# Patient Record
Sex: Male | Born: 1990
Health system: Southern US, Community
[De-identification: ages and names within clinical notes are randomized; demographics above are authoritative.]

## PROBLEM LIST (undated history)

## (undated) DIAGNOSIS — E669 Obesity, unspecified: Secondary | ICD-10-CM

## (undated) DIAGNOSIS — F419 Anxiety disorder, unspecified: Secondary | ICD-10-CM

## (undated) DIAGNOSIS — R05 Cough: Secondary | ICD-10-CM

## (undated) DIAGNOSIS — G8929 Other chronic pain: Principal | ICD-10-CM

## (undated) DIAGNOSIS — G4733 Obstructive sleep apnea (adult) (pediatric): Secondary | ICD-10-CM

## (undated) DIAGNOSIS — R0683 Snoring: Secondary | ICD-10-CM

## (undated) DIAGNOSIS — M25532 Pain in left wrist: Principal | ICD-10-CM

## (undated) DIAGNOSIS — F410 Panic disorder [episodic paroxysmal anxiety] without agoraphobia: Secondary | ICD-10-CM

## (undated) DIAGNOSIS — J3489 Other specified disorders of nose and nasal sinuses: Secondary | ICD-10-CM

## (undated) DIAGNOSIS — I493 Ventricular premature depolarization: Secondary | ICD-10-CM

## (undated) DIAGNOSIS — M659 Synovitis and tenosynovitis, unspecified: Secondary | ICD-10-CM

## (undated) DIAGNOSIS — K219 Gastro-esophageal reflux disease without esophagitis: Secondary | ICD-10-CM

## (undated) HISTORY — PX: WISDOM TOOTH EXTRACTION: SHX21

## (undated) HISTORY — DX: Obesity, unspecified: E66.9

## (undated) HISTORY — DX: Ventricular premature depolarization: I49.3

## (undated) HISTORY — DX: Snoring: R06.83

## (undated) HISTORY — DX: Other chronic pain: G89.29

## (undated) HISTORY — DX: Obstructive sleep apnea (adult) (pediatric): G47.33

## (undated) HISTORY — DX: Pain in left wrist: M25.532

---
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
## 2009-01-02 ENCOUNTER — Ambulatory Visit (HOSPITAL_COMMUNITY): Admission: RE | Admit: 2009-01-02 | Discharge: 2009-01-02 | Payer: Self-pay | Admitting: Podiatry

## 2009-06-04 ENCOUNTER — Encounter: Payer: Self-pay | Admitting: Cardiology

## 2012-01-19 ENCOUNTER — Ambulatory Visit
Admission: RE | Admit: 2012-01-19 | Discharge: 2012-01-19 | Disposition: A | Discharge status: Home / Self Care | Payer: 59 | Source: Ambulatory Visit | Attending: Family Medicine | Admitting: Family Medicine

## 2012-01-19 ENCOUNTER — Other Ambulatory Visit: Payer: Self-pay | Admitting: Family Medicine

## 2012-01-19 DIAGNOSIS — M79643 Pain in unspecified hand: Secondary | ICD-10-CM

## 2012-01-19 DIAGNOSIS — M25539 Pain in unspecified wrist: Secondary | ICD-10-CM

## 2012-02-19 DIAGNOSIS — M659 Synovitis and tenosynovitis, unspecified: Secondary | ICD-10-CM

## 2012-02-19 DIAGNOSIS — M65949 Unspecified synovitis and tenosynovitis, unspecified hand: Secondary | ICD-10-CM

## 2012-02-19 HISTORY — DX: Unspecified synovitis and tenosynovitis, unspecified hand: M65.949

## 2012-02-19 HISTORY — DX: Synovitis and tenosynovitis, unspecified: M65.9

## 2012-03-11 ENCOUNTER — Encounter (HOSPITAL_BASED_OUTPATIENT_CLINIC_OR_DEPARTMENT_OTHER): Payer: Self-pay | Admitting: *Deleted

## 2012-03-11 DIAGNOSIS — J3489 Other specified disorders of nose and nasal sinuses: Secondary | ICD-10-CM

## 2012-03-11 DIAGNOSIS — R059 Cough, unspecified: Secondary | ICD-10-CM

## 2012-03-11 HISTORY — DX: Other specified disorders of nose and nasal sinuses: J34.89

## 2012-03-11 HISTORY — DX: Cough, unspecified: R05.9

## 2012-03-16 ENCOUNTER — Encounter (HOSPITAL_BASED_OUTPATIENT_CLINIC_OR_DEPARTMENT_OTHER): Payer: Self-pay | Admitting: Anesthesiology

## 2012-03-16 ENCOUNTER — Encounter (HOSPITAL_BASED_OUTPATIENT_CLINIC_OR_DEPARTMENT_OTHER)
Admission: RE | Disposition: A | Discharge status: Home / Self Care | Payer: Self-pay | Source: Ambulatory Visit | Attending: Orthopedic Surgery

## 2012-03-16 ENCOUNTER — Encounter (HOSPITAL_BASED_OUTPATIENT_CLINIC_OR_DEPARTMENT_OTHER): Payer: Self-pay | Admitting: Certified Registered"

## 2012-03-16 ENCOUNTER — Ambulatory Visit (HOSPITAL_BASED_OUTPATIENT_CLINIC_OR_DEPARTMENT_OTHER): Payer: 59 | Admitting: Anesthesiology

## 2012-03-16 ENCOUNTER — Encounter (HOSPITAL_BASED_OUTPATIENT_CLINIC_OR_DEPARTMENT_OTHER): Payer: Self-pay | Admitting: Orthopedic Surgery

## 2012-03-16 ENCOUNTER — Ambulatory Visit (HOSPITAL_BASED_OUTPATIENT_CLINIC_OR_DEPARTMENT_OTHER)
Admission: RE | Admit: 2012-03-16 | Discharge: 2012-03-16 | Disposition: A | Discharge status: Home / Self Care | Payer: 59 | Source: Ambulatory Visit | Attending: Orthopedic Surgery | Admitting: Orthopedic Surgery

## 2012-03-16 DIAGNOSIS — M65839 Other synovitis and tenosynovitis, unspecified forearm: Secondary | ICD-10-CM | POA: Insufficient documentation

## 2012-03-16 HISTORY — DX: Synovitis and tenosynovitis, unspecified: M65.9

## 2012-03-16 HISTORY — DX: Cough: R05

## 2012-03-16 HISTORY — DX: Other specified disorders of nose and nasal sinuses: J34.89

## 2012-03-16 LAB — POCT HEMOGLOBIN-HEMACUE: Hemoglobin: 14.3 g/dL (ref 13.0–17.0)

## 2012-03-16 SURGERY — TENOSYNOVECTOMY
Anesthesia: General | Site: Wrist | Laterality: Left | Wound class: Clean

## 2012-03-16 MED ORDER — LACTATED RINGERS IV SOLN
INTRAVENOUS | Status: DC
  Administered 2012-03-16 (×2): via INTRAVENOUS

## 2012-03-16 MED ORDER — LORAZEPAM 2 MG/ML IJ SOLN: 1.0000 mg | Freq: Once | INTRAMUSCULAR | Status: DC | PRN

## 2012-03-16 MED ORDER — FENTANYL CITRATE 0.05 MG/ML IJ SOLN
INTRAMUSCULAR | Status: DC | PRN
  Administered 2012-03-16: 100 ug via INTRAVENOUS

## 2012-03-16 MED ORDER — LIDOCAINE HCL (CARDIAC) 20 MG/ML IV SOLN
INTRAVENOUS | Status: DC | PRN
  Administered 2012-03-16: 100 mg via INTRAVENOUS

## 2012-03-16 MED ORDER — MIDAZOLAM HCL 2 MG/2ML IJ SOLN: 1.0000 mg | INTRAMUSCULAR | Status: DC | PRN

## 2012-03-16 MED ORDER — MIDAZOLAM HCL 5 MG/5ML IJ SOLN
INTRAMUSCULAR | Status: DC | PRN
  Administered 2012-03-16: 2 mg via INTRAVENOUS

## 2012-03-16 MED ORDER — FENTANYL CITRATE 0.05 MG/ML IJ SOLN: 50.0000 ug | INTRAMUSCULAR | Status: DC | PRN

## 2012-03-16 MED ORDER — PROPOFOL 10 MG/ML IV EMUL
INTRAVENOUS | Status: DC | PRN
  Administered 2012-03-16: 300 mg via INTRAVENOUS

## 2012-03-16 MED ORDER — HYDROMORPHONE HCL PF 1 MG/ML IJ SOLN
0.2500 mg | INTRAMUSCULAR | Status: DC | PRN
  Administered 2012-03-16 (×2): 0.5 mg via INTRAVENOUS

## 2012-03-16 MED ORDER — CEFAZOLIN SODIUM 1-5 GM-% IV SOLN
INTRAVENOUS | Status: DC | PRN
  Administered 2012-03-16: 2 g via INTRAVENOUS

## 2012-03-16 MED ORDER — DOXYCYCLINE HYCLATE 50 MG PO CAPS: 100.0000 mg | ORAL_CAPSULE | Freq: Two times a day (BID) | ORAL | Status: AC

## 2012-03-16 MED ORDER — CHLORHEXIDINE GLUCONATE 4 % EX LIQD: 60.0000 mL | Freq: Once | CUTANEOUS | Status: DC

## 2012-03-16 MED ORDER — HYDROCODONE-ACETAMINOPHEN 5-500 MG PO TABS: 1.0000 | ORAL_TABLET | ORAL | Status: AC | PRN

## 2012-03-16 MED ORDER — BUPIVACAINE HCL (PF) 0.25 % IJ SOLN
INTRAMUSCULAR | Status: DC | PRN
  Administered 2012-03-16: 10 mL

## 2012-03-16 MED ORDER — DEXAMETHASONE SODIUM PHOSPHATE 4 MG/ML IJ SOLN
INTRAMUSCULAR | Status: DC | PRN
  Administered 2012-03-16: 8 mg via INTRAVENOUS

## 2012-03-16 SURGICAL SUPPLY — 67 items
BAG DECANTER FOR FLEXI CONT (MISCELLANEOUS) IMPLANT
BANDAGE GAUZE ELAST BULKY 4 IN (GAUZE/BANDAGES/DRESSINGS) ×2 IMPLANT
BLADE MINI RND TIP GREEN BEAV (BLADE) IMPLANT
BLADE SURG 15 STRL LF DISP TIS (BLADE) ×1 IMPLANT
BLADE SURG 15 STRL SS (BLADE) ×1
BNDG COHESIVE 3X5 TAN STRL LF (GAUZE/BANDAGES/DRESSINGS) ×2 IMPLANT
BNDG ESMARK 4X9 LF (GAUZE/BANDAGES/DRESSINGS) ×2 IMPLANT
CHLORAPREP W/TINT 26ML (MISCELLANEOUS) ×2 IMPLANT
CLOTH BEACON ORANGE TIMEOUT ST (SAFETY) ×2 IMPLANT
CONT SPECI 4OZ STER CLIK (MISCELLANEOUS) ×2 IMPLANT
CORDS BIPOLAR (ELECTRODE) ×2 IMPLANT
COVER MAYO STAND STRL (DRAPES) ×2 IMPLANT
COVER TABLE BACK 60X90 (DRAPES) ×2 IMPLANT
CUFF TOURNIQUET SINGLE 18IN (TOURNIQUET CUFF) IMPLANT
DECANTER SPIKE VIAL GLASS SM (MISCELLANEOUS) IMPLANT
DRAIN TLS ROUND 10FR (DRAIN) IMPLANT
DRAPE EXTREMITY T 121X128X90 (DRAPE) ×2 IMPLANT
DRAPE SURG 17X23 STRL (DRAPES) ×2 IMPLANT
GAUZE SPONGE 4X4 16PLY XRAY LF (GAUZE/BANDAGES/DRESSINGS) IMPLANT
GAUZE XEROFORM 1X8 LF (GAUZE/BANDAGES/DRESSINGS) ×2 IMPLANT
GLOVE BIO SURGEON STRL SZ 6.5 (GLOVE) ×2 IMPLANT
GLOVE BIO SURGEON STRL SZ8.5 (GLOVE) ×2 IMPLANT
GLOVE SURG ORTHO 8.0 STRL STRW (GLOVE) ×2 IMPLANT
GOWN BRE IMP PREV XXLGXLNG (GOWN DISPOSABLE) ×2 IMPLANT
GOWN PREVENTION PLUS XLARGE (GOWN DISPOSABLE) ×2 IMPLANT
GOWN STRL REIN 2XL LVL4 (GOWN DISPOSABLE) ×2 IMPLANT
KWIRE 4.0 X .035IN (WIRE) IMPLANT
LOOP VESSEL MAXI BLUE (MISCELLANEOUS) IMPLANT
NEEDLE 27GAX1X1/2 (NEEDLE) ×2 IMPLANT
NEEDLE HYPO 22GX1.5 SAFETY (NEEDLE) IMPLANT
NEEDLE KEITH (NEEDLE) IMPLANT
NS IRRIG 1000ML POUR BTL (IV SOLUTION) ×4 IMPLANT
PACK BASIN DAY SURGERY FS (CUSTOM PROCEDURE TRAY) ×2 IMPLANT
PAD CAST 3X4 CTTN HI CHSV (CAST SUPPLIES) ×1 IMPLANT
PADDING CAST ABS 3INX4YD NS (CAST SUPPLIES)
PADDING CAST ABS 4INX4YD NS (CAST SUPPLIES) ×1
PADDING CAST ABS COTTON 3X4 (CAST SUPPLIES) IMPLANT
PADDING CAST ABS COTTON 4X4 ST (CAST SUPPLIES) ×1 IMPLANT
PADDING CAST COTTON 3X4 STRL (CAST SUPPLIES) ×1
SLEEVE SCD COMPRESS KNEE MED (MISCELLANEOUS) ×2 IMPLANT
SPLINT PLASTER CAST XFAST 3X15 (CAST SUPPLIES) ×5 IMPLANT
SPLINT PLASTER XTRA FASTSET 3X (CAST SUPPLIES) ×5
SPONGE GAUZE 4X4 12PLY (GAUZE/BANDAGES/DRESSINGS) ×2 IMPLANT
STOCKINETTE 4X48 STRL (DRAPES) ×2 IMPLANT
SUT ETHIBOND 3-0 V-5 (SUTURE) IMPLANT
SUT ETHILON 4 0 PS 2 18 (SUTURE) ×2 IMPLANT
SUT MERSILENE 2.0 SH NDLE (SUTURE) IMPLANT
SUT MERSILENE 3 0 FS 1 (SUTURE) IMPLANT
SUT MERSILENE 4 0 P 3 (SUTURE) IMPLANT
SUT PROLENE 2 0 SH DA (SUTURE) IMPLANT
SUT SILK 4 0 PS 2 (SUTURE) IMPLANT
SUT STEEL 4 0 V 26 (SUTURE) IMPLANT
SUT VIC AB 3-0 PS1 18 (SUTURE)
SUT VIC AB 3-0 PS1 18XBRD (SUTURE) IMPLANT
SUT VIC AB 4-0 P-3 18XBRD (SUTURE) IMPLANT
SUT VIC AB 4-0 P3 18 (SUTURE)
SUT VICRYL 4-0 PS2 18IN ABS (SUTURE) ×2 IMPLANT
SUT VICRYL RAPID 5 0 P 3 (SUTURE) IMPLANT
SUT VICRYL RAPIDE 4/0 PS 2 (SUTURE) ×2 IMPLANT
SWAB CULTURE LIQ STUART DBL (MISCELLANEOUS) ×2 IMPLANT
SYR BULB 3OZ (MISCELLANEOUS) ×2 IMPLANT
SYR CONTROL 10ML LL (SYRINGE) ×2 IMPLANT
TOWEL OR 17X24 6PK STRL BLUE (TOWEL DISPOSABLE) ×4 IMPLANT
TUBE ANAEROBIC SPECIMEN COL (MISCELLANEOUS) ×2 IMPLANT
TUBE FEEDING 5FR 15 INCH (TUBING) IMPLANT
UNDERPAD 30X30 INCONTINENT (UNDERPADS AND DIAPERS) ×2 IMPLANT
WATER STERILE IRR 1000ML POUR (IV SOLUTION) ×2 IMPLANT

## 2012-03-16 NOTE — Op Note (Signed)
NAME:  Derrick Hart, Derrick Hart NO.:  [id number]  MEDICAL RECORD NO.:  [id number]  LOCATION:                                 FACILITY:  PHYSICIAN:  Cindee Salt, M.D.            DATE OF BIRTH:  DATE OF PROCEDURE:  03/16/2012 DATE OF DISCHARGE:                              OPERATIVE REPORT   PREOPERATIVE DIAGNOSES:  Tenosynovitis extensor tendons, index and middle fingers, left wrist.  POSTOPERATIVE DIAGNOSIS:  Tenosynovitis extensor tendons, index and middle fingers, left wrist.  OPERATION:  Tenosynovectomy with biopsy of wrist synovium, debridement of capitate metacarpals, index middle fingers.  SURGEON:  Cindee Salt, M.D.  ASSISTANT:  Artist Pais. Mina Marble, M.D.  ANESTHESIA:  General with local infiltration.  ANESTHESIOLOGIST:  Bedelia Person, M.D.  HISTORY:  The patient is a 21 year old male with a 2-3 month history of swelling of the dorsum of his hand.  This has not responded to conservative treatment.  MRI reveals damage to the capitate, the base of the index, middle finger metacarpals with a major tenosynovitis, swelling of the joint.  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  He is aware that there is no guarantee with the surgery, possibility of infection, recurrence, injury to arteries, nerves, tendons, incomplete relief of symptoms, dystrophy.  Preoperative area, the patient was seen, the extremity was marked by both the patient and surgeon.  Antibiotic given.  PROCEDURE:  The patient was brought to the operating room where a general anesthetic was carried out without difficulty.  He was prepped using ChloraPrep, supine position, left arm free.  Three minutes dry time was allowed.  Time-out taken, confirming the patient, procedure. The limb was exsanguinated from the wrist proximally with an Esmarch bandage.  Tourniquet placed and the arm was  inflated to 250 mmHg.  A longitudinal incision was made over the dorsum of his wrist carried down through subcutaneous tissue.  Bleeders were electrocauterized with bipolar.  Dorsal nerve was identified and protected.  The tenosynovium was opened.  This was found to be swollen.  A biopsy was taken of the tenosynovial tissue and sent.  Cultures were also taken, both aerobic and anaerobic cultures.  The dissection was carried into the wrist.  A very significant synovitis was present with a brownish thin multilobulated friable tissue.  This appeared to be external to the bone, although there was some ingrowth into the base of the metacarpal of the index middle fingers.  The entire area was debrided with a rongeur.  Cultures were again taken for both aerobic and anaerobic, fungal and AFB cultures including marinum.  These were sent to pathology.  No gross purulence was noted.  The wound was copiously irrigated with saline.  The cartilage appeared to be intact, and to capitate the STT joint, both of which had involvement.  The capsule was then closed after irrigation with figure-of-eight 4-0 Vicryl sutures. The retinaculum closed with interrupted 4-0 Vicryl, the subcutaneous tissue with interrupted 4-0 Vicryl, and the skin with interrupted 4-0 nylon sutures.  A sterile compressive dressing and  splint to the wrist was applied.  On deflation of the tourniquet, all fingers immediately pinked.  He was taken to the recovery room for observation in satisfactory condition.          ______________________________ Cindee Salt, M.D.     GK/MEDQ  D:  03/16/2012  T:  03/16/2012  Job:  [id number]  cc:   Dyke Brackett, M.D.

## 2012-03-16 NOTE — Anesthesia Procedure Notes (Signed)
Procedure Name: LMA Insertion Date/Time: 03/16/2012 11:00 AM Performed by: Verlan Friends Pre-anesthesia Checklist: Patient identified, Emergency Drugs available, Suction available, Patient being monitored and Timeout performed Patient Re-evaluated:Patient Re-evaluated prior to inductionOxygen Delivery Method: Circle System Utilized Preoxygenation: Pre-oxygenation with 100% oxygen Intubation Type: IV induction Ventilation: Mask ventilation without difficulty LMA: LMA inserted LMA Size: 5.0 Number of attempts: 1 (atraumatic) Airway Equipment and Method: bite block (Left posterior bite gard used) Placement Confirmation: positive ETCO2 Tube secured with: Tape (clear tape used) Dental Injury: Teeth and Oropharynx as per pre-operative assessment

## 2012-03-16 NOTE — Discharge Instructions (Addendum)
  [facility]  [address] [place], [place] [address] [phone]   Post Anesthesia Home Care Instructions  Activity: Get plenty of rest for the remainder of the day. A responsible adult should stay with you for 24 hours following the procedure.  For the next 24 hours, DO NOT: -Drive a car -Operate machinery -Drink alcoholic beverages -Take any medication unless instructed by your physician -Make any legal decisions or sign important papers.  Meals: Start with liquid foods such as gelatin or soup. Progress to regular foods as tolerated. Avoid greasy, spicy, heavy foods. If nausea and/or vomiting occur, drink only clear liquids until the nausea and/or vomiting subsides. Call your physician if vomiting continues.  Special Instructions/Symptoms: Your throat may feel dry or sore from the anesthesia or the breathing tube placed in your throat during surgery. If this causes discomfort, gargle with warm salt water. The discomfort should disappear within 24 hours.    [facility] Instructions Hand Surgery  Wound Care: Keep your hand elevated above the level of your heart.  Do not allow it to dangle  by your side.  Keep the dressing dry and do not remove it unless your doctor advises you to do so.  He will usually change it at the time of your post-op visit.  Moving your fingers is advised to stimulate circulation but will depend on the site of your surgery.  If you have a splint applied, your doctor will advise you regarding movement.  Activity: Do not drive or operate machinery today.  Rest today and then you may return to your normal activity and work as indicated by your physician.  Diet:  Drink liquids today or eat a light diet.  You may resume a regular diet tomorrow.    General expectations: Pain for two to three days. Fingers may become slightly swollen.  Call your doctor if any of the following occur: Severe pain not relieved by pain  medication. Elevated temperature. Dressing soaked with blood. Inability to move fingers. White or bluish color to fingers. 

## 2012-03-16 NOTE — Op Note (Signed)
Dictated [phone] [id number]

## 2012-03-16 NOTE — Anesthesia Preprocedure Evaluation (Addendum)
Anesthesia Evaluation  Patient identified by MRN, date of birth, ID band Patient awake    Reviewed: Allergy & Precautions, H&P , NPO status , Patient's Chart, lab work & pertinent test results  Airway Mallampati: I TM Distance: >3 FB Neck ROM: Full    Dental   Pulmonary Recent URI ,  + rhonchi         Cardiovascular     Neuro/Psych  Neuromuscular disease    GI/Hepatic   Endo/Other    Renal/GU      Musculoskeletal   Abdominal   Peds  Hematology   Anesthesia Other Findings Afebrile Improving cough, nonproductive  Reproductive/Obstetrics                          Anesthesia Physical Anesthesia Plan  ASA: II  Anesthesia Plan: General   Post-op Pain Management:    Induction: Intravenous  Airway Management Planned: LMA  Additional Equipment:   Intra-op Plan:   Post-operative Plan: Extubation in OR  Informed Consent: I have reviewed the patients History and Physical, chart, labs and discussed the procedure including the risks, benefits and alternatives for the proposed anesthesia with the patient or authorized representative who has indicated his/her understanding and acceptance.     Plan Discussed with: CRNA and Surgeon  Anesthesia Plan Comments:         Anesthesia Quick Evaluation

## 2012-03-16 NOTE — Brief Op Note (Signed)
03/16/2012  11:39 AM  PATIENT:  Derrick Hart  21 y.o. male  PRE-OPERATIVE DIAGNOSIS:  tenosynovitis left hand  POST-OPERATIVE DIAGNOSIS:  * No post-op diagnosis entered *  PROCEDURE:  Procedure(s) (LRB): TENOSYNOVECTOMY (Left)  SURGEON:  Surgeon(s) and Role:    * Nicki Reaper, MD - Primary    * Marlowe Shores, MD - Assisting  PHYSICIAN ASSISTANT:   ASSISTANTS: Alyssa Grove, MD   ANESTHESIA:   local and general  EBL:  Total I/O In: 200 [I.V.:200] Out: -   BLOOD ADMINISTERED:none  DRAINS: none   LOCAL MEDICATIONS USED:  MARCAINE     SPECIMEN:  Biopsy / Limited Resection  DISPOSITION OF SPECIMEN:  PATHOLOGY  COUNTS:  YES  TOURNIQUET:  * Missing tourniquet times found for documented tourniquets in log:  30826 *  DICTATION: .Other Dictation: Dictation Number [id number]  PLAN OF CARE: Discharge to home after PACU  PATIENT DISPOSITION:  PACU - hemodynamically stable.

## 2012-03-16 NOTE — H&P (Signed)
   Derrick Hart is a 21 year old right hand dominant male referred by Dr. Madelon Lips for a consultation with respect to swelling and pain of his left hand. This has been going on for approximately 2 months. He has no specific history of injury. He has no prior history of occurrence. He is not complaining of any fevers or chills. He states that he has had falls snowboarding. He has been placed in a brace and on Motrin which gave him some relief of symptoms. The swelling is primarily in the dorsal aspect of his hand. He complains of constant, moderate, dull, aching type pain with a feeling of swelling and weakness. It has not significantly changed. It will occasionally awaken him at night. Activity, exercise and work makes this worse, ice has helped along with Aleve and a brace. No history of diabetes, thyroid problems, arthritis or gout. There is no history of recent illnesses predating the beginning of the occurrence of this problem.  Past Medical History: He has no allergies. He takes no medicine. He has had no surgery.  Family Medical History: Negative.  Social History: He does not smoke. He drinks socially. He is single and a Investment banker, operational at [employer].  Review of Systems: Positive for contacts, otherwise negative for 14 points. Derrick Hart is an 21 y.o. male.   Chief Complaint: swelling lt wrist HPI: see above  Past Medical History  Diagnosis Date  . Tenosynovitis of hand 02/2012    left  . Cough 03/11/2012  . Stuffy and runny nose 03/11/2012    clear drainage from nose    Past Surgical History  Procedure Date  . Wisdom tooth extraction     History reviewed. No pertinent family history. Social History:  reports that he has never smoked. He has never used smokeless tobacco. He reports that he drinks alcohol. He reports that he does not use illicit drugs.  Allergies: No Known Allergies  No current facility-administered medications on file as of 03/16/2012.   No current outpatient  prescriptions on file as of 03/16/2012.    No results found for this or any previous visit (from the past 48 hour(s)).  No results found.   Pertinent items are noted in HPI.  Height 6\' 4"  (1.93 m), weight 120.203 kg (265 lb).  General appearance: alert, cooperative and appears stated age Head: Normocephalic, without obvious abnormality Neck: no adenopathy Resp: clear to auscultation bilaterally Cardio: regular rate and rhythm, S1, S2 normal, no murmur, click, rub or gallop GI: soft, non-tender; bowel sounds normal; no masses,  no organomegaly Extremities: extremities normal, atraumatic, no cyanosis or edema Pulses: 2+ and symmetric Skin: Skin color, texture, turgor normal. No rashes or lesions Neurologic: Grossly normal Incision/Wound: na  Assessment/Plan He has had his MRI done revealing significant changes with a very significant tenosynovitis with changes in the base of his metacarpals.  The joints themselves look good.  As far as his wrist is concerned his laboratory work was normal except for an elevated C-reactive protein.  He shows no increased white count.  It is difficult to determine exactly what is going on. We would recommend a tenosynovectomy be performed. He does work with shellfish.  There is always potential that following a cut he may have developed an atypical mycobacterial infection.    We have scheduled for tenosynovectomy, biopsy metacarpal base index or middle finger of his left hand   Derrick Hart 03/16/2012, 7:39 AM

## 2012-03-16 NOTE — Transfer of Care (Signed)
Immediate Anesthesia Transfer of Care Note  Patient: Derrick Hart  Procedure(s) Performed: Procedure(s) (LRB): TENOSYNOVECTOMY (Left)  Patient Location: PACU  Anesthesia Type: General  Level of Consciousness: awake, alert , oriented and patient cooperative  Airway & Oxygen Therapy: Patient Spontanous Breathing and Patient connected to face mask oxygen  Post-op Assessment: Report given to PACU RN and Post -op Vital signs reviewed and stable  Post vital signs: Reviewed and stable  Complications: No apparent anesthesia complications

## 2012-03-19 LAB — WOUND CULTURE

## 2012-03-19 LAB — TISSUE CULTURE

## 2012-03-21 LAB — ANAEROBIC CULTURE

## 2012-03-23 NOTE — Anesthesia Postprocedure Evaluation (Signed)
  Anesthesia Post-op Note  Patient: Derrick Hart  Procedure(s) Performed: Procedure(s) (LRB): TENOSYNOVECTOMY (Left)  Patient Location: PACU  Anesthesia Type: General  Level of Consciousness: awake  Airway and Oxygen Therapy: Patient Spontanous Breathing  Post-op Pain: mild  Post-op Assessment: Post-op Vital signs reviewed  Post-op Vital Signs: stable  Complications: No apparent anesthesia complications

## 2012-04-12 LAB — FUNGUS CULTURE W SMEAR: Fungal Smear: NONE SEEN

## 2012-04-28 LAB — AFB CULTURE WITH SMEAR (NOT AT ARMC)

## 2012-05-24 ENCOUNTER — Encounter (HOSPITAL_COMMUNITY): Payer: Self-pay | Admitting: *Deleted

## 2012-05-24 ENCOUNTER — Emergency Department (HOSPITAL_COMMUNITY)
Admission: EM | Admit: 2012-05-24 | Discharge: 2012-05-24 | Disposition: A | Discharge status: Home / Self Care | Payer: 59 | Attending: Emergency Medicine | Admitting: Emergency Medicine

## 2012-05-24 DIAGNOSIS — T887XXA Unspecified adverse effect of drug or medicament, initial encounter: Secondary | ICD-10-CM | POA: Insufficient documentation

## 2012-05-24 DIAGNOSIS — T40605A Adverse effect of unspecified narcotics, initial encounter: Secondary | ICD-10-CM | POA: Insufficient documentation

## 2012-05-24 DIAGNOSIS — T50905A Adverse effect of unspecified drugs, medicaments and biological substances, initial encounter: Secondary | ICD-10-CM

## 2012-05-24 MED ORDER — ONDANSETRON 8 MG PO TBDP
8.0000 mg | ORAL_TABLET | Freq: Once | ORAL | Status: AC
  Administered 2012-05-24: 8 mg via ORAL
  Filled 2012-05-24: qty 1

## 2012-05-24 MED ORDER — HYDROCODONE-ACETAMINOPHEN 5-500 MG PO TABS: 1.0000 | ORAL_TABLET | Freq: Four times a day (QID) | ORAL | Status: AC | PRN

## 2012-05-24 MED ORDER — ONDANSETRON HCL 4 MG PO TABS: 4.0000 mg | ORAL_TABLET | Freq: Three times a day (TID) | ORAL | Status: AC | PRN

## 2012-05-24 NOTE — Discharge Instructions (Signed)
Stop taking Ultram/Tramadol.  Rest today, drink plenty of fluids, stick to a bland diet.  Change your pain medication to hydrocodone.     Drug Reaction, GI Intolerance The upset stomach you are having may be from the medications you are taking. Often an upset stomach from medication comes from irritation of the stomach. This is often not a true allergic reaction.  CAUSES  Some medications can cause reactions. This may be a mild rash, nausea and vomiting, or a life- threatening reaction called anaphylaxis. Anaphylaxis involves the whole body.  HOME CARE INSTRUCTIONS   If the only problem you are having is an upset stomach, taking the medication with some food or something in your stomach may make the problem go away. If this remedy does not work, you should contact your caregiver. It may be necessary to switch medication.   This will not work if the medication must be taken on an empty stomach.  SEEK MEDICAL CARE IF:   You develop other problems that are getting worse rather than better.   You develop new symptoms.   There is a return of the symptoms that brought you to your caregiver or to the emergency department.  SEEK IMMEDIATE MEDICAL CARE IF:   You have difficulty breathing, are wheezing, or you have a tight feeling in your chest or throat.   You have a swollen mouth, or you have hives, swelling, or itching all over your body. THIS IS AN EMERGENCY - CALL 911.   You develop severe vomiting or diarrhea.   You feel faint or pass out. THIS IS AN EMERGENCY! FAMILY OR CAREGIVER SHOULD CALL 911.  MAKE SURE YOU:   Understand these instructions.   Will watch your condition.   Will get help right away if you are not doing well or get worse.  Document Released: 11/15/2006 Document Revised: 11/26/2011 Document Reviewed: 11/25/2007 [facility] Patient Information 2012 [place], [place].Marland Kitchen

## 2012-05-24 NOTE — ED Provider Notes (Signed)
History     CSN: [id number]  Arrival date & time 05/24/12  0503   First MD Initiated Contact with Patient 05/24/12 0510      Chief Complaint  Patient presents with  . Shortness of Breath    (Consider location/radiation/quality/duration/timing/severity/associated sxs/prior treatment) HPI 21 year old male presents to emergency department complaining of nausea, chills, shortness of breath, anxiety. Patient started new medication yesterday, tramadol. Patient has history of chronic pain in his left hand due to T. no synovitis. He was started on tramadol by his orthopedist. Patient has not taken Ultram in the past. Patient reports he feels "weird". He feels like he is going to pass out. He denies any chest pain. He has occasional palpitations.   Past Medical History  Diagnosis Date  . Tenosynovitis of hand 02/2012    left  . Cough 03/11/2012  . Stuffy and runny nose 03/11/2012    clear drainage from nose    Past Surgical History  Procedure Date  . Wisdom tooth extraction     No family history on file.  History  Substance Use Topics  . Smoking status: Never Smoker   . Smokeless tobacco: Never Used  . Alcohol Use: Yes     occasionally      Review of Systems  All other systems reviewed and are negative.    Allergies  Review of patient's allergies indicates no known allergies.  Home Medications   Current Outpatient Rx  Name Route Sig Dispense Refill  . TRAMADOL HCL 50 MG PO TABS Oral Take 50 mg by mouth every 6 (six) hours as needed. For pain      BP 146/91  Pulse 81  Temp(Src) 98.4 F (36.9 C) (Oral)  Resp 20  SpO2 99%  Physical Exam  Nursing note and vitals reviewed. Constitutional: He is oriented to person, place, and time. He appears well-developed and well-nourished.  HENT:  Head: Normocephalic and atraumatic.  Nose: Nose normal.  Mouth/Throat: Oropharynx is clear and moist.  Eyes: Conjunctivae and EOM are normal. Pupils are equal, round, and reactive  to light.  Neck: Normal range of motion. Neck supple. No JVD present. No tracheal deviation present. No thyromegaly present.  Cardiovascular: Normal rate, regular rhythm, normal heart sounds and intact distal pulses.  Exam reveals no gallop and no friction rub.   No murmur heard. Pulmonary/Chest: Effort normal and breath sounds normal. No stridor. No respiratory distress. He has no wheezes. He has no rales. He exhibits no tenderness.  Abdominal: Soft. Bowel sounds are normal. He exhibits no distension and no mass. There is no tenderness. There is no rebound and no guarding.  Musculoskeletal: Normal range of motion. He exhibits no edema and no tenderness.  Lymphadenopathy:    He has no cervical adenopathy.  Neurological: He is oriented to person, place, and time. He has normal reflexes. No cranial nerve deficit. He exhibits normal muscle tone. Coordination normal.  Skin: Skin is dry. No rash noted. No erythema. No pallor.  Psychiatric: He has a normal mood and affect. His behavior is normal. Judgment and thought content normal.    ED Course  Procedures (including critical care time)  Labs Reviewed - No data to display No results found.   1. Medication reaction      Date: 05/24/2012  Rate: 68  Rhythm: normal sinus rhythm  QRS Axis: normal  Intervals: normal  ST/T Wave abnormalities: early repolarization  Conduction Disutrbances:none  Narrative Interpretation: q waves inferiorly, unchanged from prior.  t wave inversion in  III new.  Old EKG Reviewed: changes noted   MDM  21 yo male with probable adverse reaction to ultram.  Will check ekg, orthostatics, and tx with zofran.    7:32 AM Pt feeling better.  Will d/c home.        Olivia Mackie, MD 05/24/12 [phone]

## 2012-05-24 NOTE — ED Notes (Signed)
Pt states he feels dizzy and feels like he is going to pass out.  BP is 146/91, HR 76, and 98% on RA and temp of 98.4.Pt is having nausea at this time also.

## 2012-05-24 NOTE — ED Notes (Signed)
Pt started on tramadol yesterday at 3pm; took a second tramadol at 2100; states unable to fall asleep; started feeling "weird" around 0230; started having chills/sweating/nausea. States felt like having panic attack about an hour pta. Hyperventilating on arrival. sats 100%

## 2013-08-11 ENCOUNTER — Encounter (HOSPITAL_COMMUNITY): Payer: Self-pay | Admitting: Emergency Medicine

## 2013-08-11 ENCOUNTER — Emergency Department (HOSPITAL_COMMUNITY): Payer: 59

## 2013-08-11 ENCOUNTER — Emergency Department (HOSPITAL_COMMUNITY)
Admission: EM | Admit: 2013-08-11 | Discharge: 2013-08-11 | Disposition: A | Discharge status: Home / Self Care | Payer: 59 | Attending: Emergency Medicine | Admitting: Emergency Medicine

## 2013-08-11 DIAGNOSIS — Z8709 Personal history of other diseases of the respiratory system: Secondary | ICD-10-CM | POA: Insufficient documentation

## 2013-08-11 DIAGNOSIS — R079 Chest pain, unspecified: Secondary | ICD-10-CM | POA: Diagnosis present

## 2013-08-11 DIAGNOSIS — R0602 Shortness of breath: Secondary | ICD-10-CM | POA: Insufficient documentation

## 2013-08-11 DIAGNOSIS — F410 Panic disorder [episodic paroxysmal anxiety] without agoraphobia: Secondary | ICD-10-CM | POA: Diagnosis present

## 2013-08-11 DIAGNOSIS — Z79899 Other long term (current) drug therapy: Secondary | ICD-10-CM | POA: Insufficient documentation

## 2013-08-11 DIAGNOSIS — Z8739 Personal history of other diseases of the musculoskeletal system and connective tissue: Secondary | ICD-10-CM | POA: Insufficient documentation

## 2013-08-11 DIAGNOSIS — Z791 Long term (current) use of non-steroidal anti-inflammatories (NSAID): Secondary | ICD-10-CM | POA: Insufficient documentation

## 2013-08-11 DIAGNOSIS — R0789 Other chest pain: Secondary | ICD-10-CM | POA: Insufficient documentation

## 2013-08-11 NOTE — ED Notes (Signed)
Pt states that today he felt a sharp pain in chest every time he took a deep breath now he feels as if he cannot catch his breath. Denies headache or dizziness

## 2013-08-11 NOTE — ED Provider Notes (Signed)
CSN: [id number]     Arrival date & time 08/11/13  1358 History     First MD Initiated Contact with Patient 08/11/13 1457     Chief Complaint  Patient presents with  . chest discomfo   . Shortness of Breath   (Consider location/radiation/quality/duration/timing/severity/associated sxs/prior Treatment) Patient is a 22 y.o. male presenting with shortness of breath. The history is provided by the patient.  Shortness of Breath Severity:  Mild Onset quality:  Sudden Duration: 1.5 hrs. Timing:  Constant Progression:  Resolved Chronicity: has had similar sx w/ panic attacks. 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 Worsened by:  Nothing tried Ineffective treatments:  None tried Associated symptoms: chest pain (left upper chest, worse with breathing, lasted 10 minutes and resolved)   Associated symptoms: no abdominal pain, no cough, no fever, no headaches, no neck pain and no vomiting     Past Medical History  Diagnosis Date  . Tenosynovitis of hand 02/2012    left  . Cough 03/11/2012  . Stuffy and runny nose 03/11/2012    clear drainage from nose   Past Surgical History  Procedure Laterality Date  . Wisdom tooth extraction     No family history on file. History  Substance Use Topics  . Smoking status: Never Smoker   . Smokeless tobacco: Never Used  . Alcohol Use: Yes     Comment: occasionally    Review of Systems  Constitutional: Negative for fever.  HENT: Negative for rhinorrhea, drooling and neck pain.   Eyes: Negative for pain.  Respiratory: Positive for shortness of breath. Negative for cough.   Cardiovascular: Positive for chest pain (left upper chest, worse with breathing, lasted 10 minutes and resolved). Negative for leg swelling.  Gastrointestinal: Negative for nausea, vomiting, abdominal pain and diarrhea.  Genitourinary: Negative for dysuria and hematuria.  Musculoskeletal: Negative for gait problem.  Skin: Negative for color change.   Neurological: Negative for numbness and headaches.  Hematological: Negative for adenopathy.  Psychiatric/Behavioral: Negative for behavioral problems.  All other systems reviewed and are negative.    Allergies  Tramadol  Home Medications   Current Outpatient Rx  Name  Route  Sig  Dispense  Refill  . ALPRAZolam (XANAX) 0.5 MG tablet   Oral   Take 0.5 mg by mouth daily as needed for sleep.         . naproxen sodium (ANAPROX) 220 MG tablet   Oral   Take 440 mg by mouth 2 (two) times daily with a meal.         . sertraline (ZOLOFT) 100 MG tablet   Oral   Take 100 mg by mouth daily.          BP 141/82  Pulse 107  Temp(Src) 98.2 F (36.8 C) (Oral)  Resp 22  SpO2 97% Physical Exam  Nursing note and vitals reviewed. Constitutional: He is oriented to person, place, and time. He appears well-developed and well-nourished.  HENT:  Head: Normocephalic and atraumatic.  Right Ear: External ear normal.  Left Ear: External ear normal.  Nose: Nose normal.  Mouth/Throat: Oropharynx is clear and moist. No oropharyngeal exudate.  Eyes: Conjunctivae and EOM are normal. Pupils are equal, round, and reactive to light.  Neck: Normal range of motion. Neck supple.  Cardiovascular: Normal rate, regular rhythm, normal heart sounds and intact distal pulses.  Exam reveals no gallop and no friction rub.   No murmur heard. Pulmonary/Chest: Effort normal and breath sounds normal. No respiratory  distress. He has no wheezes.  Abdominal: Soft. Bowel sounds are normal. He exhibits no distension. There is no tenderness. There is no rebound and no guarding.  Musculoskeletal: Normal range of motion. He exhibits no edema and no tenderness.  Normal appearing LE's. No asymmetry. 2+ distal pulses.   Neurological: He is alert and oriented to person, place, and time.  Skin: Skin is warm and dry.  Psychiatric: He has a normal mood and affect. His behavior is normal.    ED Course   Procedures  (including critical care time)  Labs Reviewed - No data to display Dg Chest 2 View  08/11/2013   *RADIOLOGY REPORT*  Clinical Data: Left-sided chest pain.  CHEST - 2 VIEW  Comparison: None.  Findings: Central pulmonary vascular prominence.  No infiltrate, congestive heart failure or pneumothorax.  Heart size within normal limits.  IMPRESSION: Central pulmonary vascular prominence.  No infiltrate, congestive heart failure or pneumothorax.  Heart size within normal limits.   Original Report Authenticated By: Lacy Duverney, M.D.   1. Panic attack   2. Chest pain      Date: 08/11/2013  Rate: 95  Rhythm: normal sinus rhythm  QRS Axis: normal  Intervals: normal  ST/T Wave abnormalities: normal  Conduction Disutrbances:none  Narrative Interpretation: No ST or T wave changes consistent with ischemia.    Old EKG Reviewed: none available    MDM  3:07 PM 22 y.o. male w hx of panic attacks (usually 1 per week) pw sudden onset left sided sharp cp and then anxiety/sob. Sx lasted 1-1.5 hrs and resolved w/ xanax. Pt asx on exam, appears well, now feeling better. Doubt PE as sx resolved. HR 107 initially in triage, now 95 on exam and at rest. Wells/Perc neg.   4:00 PM: Pt continues to appear well. Remains asx. I have discussed the diagnosis/risks/treatment options with the patient and believe the pt to be eligible for discharge home to follow-up with pcp as needed. We also discussed returning to the ED immediately if new or worsening sx occur. We discussed the sx which are most concerning (e.g., further cp, sob, fever) that necessitate immediate return. Any new prescriptions provided to the patient are listed below.  New Prescriptions   No medications on file        Junius Argyle, MD 08/12/13 [phone]

## 2015-06-03 ENCOUNTER — Emergency Department (HOSPITAL_COMMUNITY): Payer: 59

## 2015-06-03 ENCOUNTER — Encounter (HOSPITAL_COMMUNITY): Payer: Self-pay | Admitting: *Deleted

## 2015-06-03 ENCOUNTER — Emergency Department (HOSPITAL_COMMUNITY)
Admission: EM | Admit: 2015-06-03 | Discharge: 2015-06-03 | Disposition: A | Discharge status: Home / Self Care | Payer: 59 | Attending: Emergency Medicine | Admitting: Emergency Medicine

## 2015-06-03 DIAGNOSIS — R0789 Other chest pain: Secondary | ICD-10-CM | POA: Diagnosis not present

## 2015-06-03 DIAGNOSIS — Z8614 Personal history of Methicillin resistant Staphylococcus aureus infection: Secondary | ICD-10-CM | POA: Insufficient documentation

## 2015-06-03 DIAGNOSIS — Z8739 Personal history of other diseases of the musculoskeletal system and connective tissue: Secondary | ICD-10-CM | POA: Diagnosis not present

## 2015-06-03 DIAGNOSIS — Z8709 Personal history of other diseases of the respiratory system: Secondary | ICD-10-CM | POA: Diagnosis not present

## 2015-06-03 DIAGNOSIS — F410 Panic disorder [episodic paroxysmal anxiety] without agoraphobia: Secondary | ICD-10-CM | POA: Insufficient documentation

## 2015-06-03 DIAGNOSIS — F419 Anxiety disorder, unspecified: Secondary | ICD-10-CM

## 2015-06-03 DIAGNOSIS — Z79899 Other long term (current) drug therapy: Secondary | ICD-10-CM | POA: Diagnosis not present

## 2015-06-03 DIAGNOSIS — R0602 Shortness of breath: Secondary | ICD-10-CM | POA: Diagnosis not present

## 2015-06-03 DIAGNOSIS — Z791 Long term (current) use of non-steroidal anti-inflammatories (NSAID): Secondary | ICD-10-CM | POA: Insufficient documentation

## 2015-06-03 DIAGNOSIS — R079 Chest pain, unspecified: Secondary | ICD-10-CM | POA: Diagnosis present

## 2015-06-03 HISTORY — DX: Anxiety disorder, unspecified: F41.9

## 2015-06-03 HISTORY — DX: Panic disorder (episodic paroxysmal anxiety): F41.0

## 2015-06-03 LAB — CBC WITH DIFFERENTIAL/PLATELET
BASOS ABS: 0 10*3/uL (ref 0.0–0.1)
Basophils Relative: 0 % (ref 0–1)
Eosinophils Absolute: 0.1 10*3/uL (ref 0.0–0.7)
Eosinophils Relative: 1 % (ref 0–5)
HCT: 44.8 % (ref 39.0–52.0)
Hemoglobin: 15.7 g/dL (ref 13.0–17.0)
LYMPHS ABS: 2.5 10*3/uL (ref 0.7–4.0)
LYMPHS PCT: 29 % (ref 12–46)
MCH: 29.7 pg (ref 26.0–34.0)
MCHC: 35 g/dL (ref 30.0–36.0)
MCV: 84.8 fL (ref 78.0–100.0)
Monocytes Absolute: 0.8 10*3/uL (ref 0.1–1.0)
Monocytes Relative: 10 % (ref 3–12)
NEUTROS ABS: 5.2 10*3/uL (ref 1.7–7.7)
NEUTROS PCT: 60 % (ref 43–77)
PLATELETS: 211 10*3/uL (ref 150–400)
RBC: 5.28 MIL/uL (ref 4.22–5.81)
RDW: 12.6 % (ref 11.5–15.5)
WBC: 8.6 10*3/uL (ref 4.0–10.5)

## 2015-06-03 LAB — BRAIN NATRIURETIC PEPTIDE: B NATRIURETIC PEPTIDE 5: 6 pg/mL (ref 0.0–100.0)

## 2015-06-03 LAB — BASIC METABOLIC PANEL
ANION GAP: 9 (ref 5–15)
BUN: 10 mg/dL (ref 6–20)
CHLORIDE: 100 mmol/L — AB (ref 101–111)
CO2: 26 mmol/L (ref 22–32)
Calcium: 9.5 mg/dL (ref 8.9–10.3)
Creatinine, Ser: 0.94 mg/dL (ref 0.61–1.24)
Glucose, Bld: 107 mg/dL — ABNORMAL HIGH (ref 65–99)
POTASSIUM: 4.5 mmol/L (ref 3.5–5.1)
SODIUM: 135 mmol/L (ref 135–145)

## 2015-06-03 LAB — TROPONIN I
Troponin I: <0.03 ng/mL (ref <0.031)
Troponin I: <0.03 ng/mL (ref <0.031)

## 2015-06-03 NOTE — Discharge Instructions (Signed)
°Emergency Department Resource Guide °1) Find a Doctor and Pay Out of Pocket °Although you won't have to find out who is covered by your insurance plan, it is a good idea to ask around and get recommendations. You will then need to call the office and see if the doctor you have chosen will accept you as a new patient and what types of options they offer for patients who are self-pay. Some doctors offer discounts or will set up payment plans for their patients who do not have insurance, but you will need to ask so you aren't surprised when you get to your appointment. ° °2) Contact Your Local Health Department °Not all health departments have doctors that can see patients for sick visits, but many do, so it is worth a call to see if yours does. If you don't know where your local health department is, you can check in your phone book. The CDC also has a tool to help you locate your state's health department, and many state websites also have listings of all of their local health departments. ° °3) Find a Walk-in Clinic °If your illness is not likely to be very severe or complicated, you may want to try a walk in clinic. These are popping up all over the country in pharmacies, drugstores, and shopping centers. They're usually staffed by nurse practitioners or physician assistants that have been trained to treat common illnesses and complaints. They're usually fairly quick and inexpensive. However, if you have serious medical issues or chronic medical problems, these are probably not your best option. ° °No Primary Care Doctor: °- Call [facility] at  832-8000 - they can help you locate a primary care doctor that  accepts your insurance, provides certain services, etc. °- Physician Referral Service- 1-800-533-3463 ° °Chronic Pain Problems: °Organization         Address  Phone   Notes  °[facility]  (336) 297-2271 Patients need to be referred by their primary care doctor.  ° °Medication  Assistance: °Organization         Address  Phone   Notes  °[place] [facility] [address] °[place], [place] [address] (336) 641-8030 --Must be a resident of [place] °-- Must have NO insurance coverage whatsoever (no Medicaid/ Medicare, etc.) °-- The pt. MUST have a primary care doctor that directs their care regularly and follows them in the community °  °[facility]  (866) 331-1348   °[employer]  (888) 892-1162   ° °Agencies that provide inexpensive medical care: °Organization         Address  Phone   Notes  °[facility]  (336) 832-8035   °[facility]    (336) 832-7272   °[facility] [address] °[place], [place] [address] (336) 832-4777   °[facility] [address], °[place] (336) 271-4999   °[facility]    (336) 373-0678   °[facility]    (336) 272-1050   °[facility] ° [address], [place] Phone:  (336) 832-4444, Fax:  (336) 832-4440 Hours of Operation:  9 am - 6 pm, M-F.  Also accepts Medicaid/Medicare and self-pay.  °[facility] ° [address], [place] Phone: (336) [phone], Fax: (336) [phone]. Hours of Operation:  8:30 am - 5:30 pm, M-F.  Also accepts Medicaid and self-pay.  °[facility] [place] [address], [place] Phone: (336) 878-6027   °[facility] [address], [place], [place] (336)723-1848, Ext. 123 Mondays & Thursdays: 7-9 AM.  First 15 patients are seen on a first come, first serve basis. °  ° °Medicaid-accepting [place] Providers: ° °Organization         Address  Phone   Notes  °[facility] [address], [place] (336) 641-2100 Also accepts self-pay patients.  °[facility] [address], [place] ° (336) 856-9996   °[facility] [address], [place]  (336) 288-8857   °[facility] [address] [place] [address], [place] (336) 299-7000   °Veita Bland [address], [place]  ° (336) 373-1557 Only accepts [place] Access Medicaid patients after they have their name applied to their card.  ° °Self-Pay (no insurance) in [place]: ° °Organization         Address  Phone   Notes  °Sickle Cell Patients, [facility] [address], [place] (336) 832-1970   °[facility] [address], [place] (336) 832-4400   °[facility] ° [address], [place] (336) 992-4800   °[facility]/Dr. Osei-Bonsu ° [address] [place] [address], [place] or [address], [place] (336) 841-8500 Phone number for both [place] and [place] locations is the same.  °[facility] [address], [place] (336) 299-0000   °[facility] [address] [place] [address], [place] or [address] (336) 852-7530 °(336) 878-2260   °[facility] [address], [place] (336) 350-1642, phone; (336) 294-5005, fax Sees patients 1st and 3rd Saturday of every month.  Must not qualify for public or private insurance (i.e. Medicaid, Medicare, [place] Health Choice, Veterans' Benefits) • Household income should be no more than 200% of the poverty level •The clinic cannot treat you if you are pregnant or think you are pregnant • Sexually transmitted diseases are not treated at the clinic.  ° ° °Dental Care: °Organization         Address  Phone  Notes  °[place] [facility] [address], [place] (336) 641-6152 Accepts children up to age 21 who are enrolled in Medicaid or [place] Health Choice; pregnant women with a Medicaid card; and children who have applied for Medicaid or [place] Health Choice, but were declined, whose parents can pay a reduced fee at time of service.  °[place]  [facility] [place]  [address], [place] (336) 641-7733 Accepts children up to age 21 who are enrolled in Medicaid or [place] Health Choice; pregnant women with a Medicaid card; and children who have applied for Medicaid or  Health Choice, but were declined, whose parents can pay a reduced fee at time of service.  °[facility] ° [address], [place] (336) 641-4533 Patients are seen by appointment only. Walk-ins are not accepted. [facility] will see patients 18 years of age and older. °Monday - Tuesday (8am-5pm) °Most Wednesdays (8:30-5pm) °$30 per visit, cash only  °[facility] ° [address], [place] (336) 641-4533 Patients are seen by appointment only. Walk-ins are not accepted. [facility] will see patients 18 years of age and older. °One   Wednesday Evening (Monthly: Volunteer Based).  $30 per visit, cash only  °[facility]  (919) 537-3737 for adults; Children under age 4, call Graduate Pediatric Dentistry at ([phone]) [phone]. Children aged 4-14, please call (919) [phone] to request a pediatric application. ° Dental services are provided in all areas of dental care including fillings, crowns and bridges, complete and partial dentures, implants, gum treatment, root canals, and extractions. Preventive care is also provided. Treatment is provided to both adults and children. °Patients are selected via a lottery and there is often a waiting list. °  °[facility] [address], °[place] ° (336) 763-8833 [email or web address] °  °[facility] [address], [place], [place] (336)723-1848, Ext. 123 Second and Fourth Thursday of each month, opens at 6:30 AM; Clinic ends at 9 AM.  Patients are seen on a first-come first-served basis, and a limited number are seen during each clinic.  ° °[facility] ° [address], [place], [place] (336) 723-7904    Eligibility Requirements °You must have lived in [place], [place], or [place] counties for at least the last three months. °  You cannot be eligible for state or federal sponsored healthcare insurance, including Veterans Administration, Medicaid, or Medicare. °  You generally cannot be eligible for healthcare insurance through your employer.  °  How to apply: °Eligibility screenings are held every Tuesday and Wednesday afternoon from 1:00 pm until 4:00 pm. You do not need an appointment for the interview!  °[facility] [address], [place], [place] 336-631-2330   °[facility]  336-342-8273   °[facility]  336-703-3100   °[facility]  336-570-6415   ° °Behavioral Health Resources in the Community: °Intensive Outpatient Programs °Organization         Address  Phone  Notes  °[facility] [address], [place], [place] 336-878-6098   °[facility] [address], [place], [place] 336-832-9800   °[facility] [address], [place], [place] ° 336-882-2125   °[facility] [address],  °[place], [place] 1-800-853-5163 or 336-641-4981   °Substance Abuse Resources °Organization         Address  Phone  Notes  °[facility]  336-882-2125   °[facility]  336-784-9470   °[facility]  336-285-9073   °[facility]  336-845-3988   °Residential & Outpatient Substance Abuse Program  1-800-659-3381   °Psychological Services °Organization         Address  Phone  Notes  °[facility]  336- 832-9600   °[facility]  336- 378-7881   °[facility] [address], [place] 1-800-853-5163 or 336-641-4981   ° °Mobile Crisis Teams °Organization         Address  Phone  Notes  °[facility], Mobile Crisis Care Unit  1-877-626-1772   °Assertive °[facility] ° [address]  [place], [place] 336-834-9664   °Sharon DeEsch [address] °[place] 336-554-5454   ° °Self-Help/Support Groups °Organization         Address  Phone             Notes  °[facility] - variety of support groups  336- 373-1402 Call for more information  °Narcotics Anonymous (NA), [facility] [address], °[place]   2 meetings at this location  ° °  Residential Treatment Programs °Organization         Address  Phone  Notes  °[facility] [address],    °[place]  1-866-801-8205   °[facility] ° [address], [place], [place] 704-293-8524   °[facility] [address], [place] 336-845-3988 Admissions: 8am-3pm M-F  °[facility] [address]    °[place], [place] 336-841-1104   °[facility] [address], [place], [place] 336-379-7146   °[facility] [address]  °[place], [place] 336-285-9073   °[facility] Intensive Outpatient [address], [place], [place] 336-852-3033   °[facility] [address]  °[place], [place] 1-877-615-2722 or 336-784-9470   °[facility] [address] [place], [place] 336-227-7417 Accepts Medicaid  °[facility] [address]  °[place] 1-800-659-3381 Substance Abuse/Addiction Treatment  ° °[place] Behavioral Health Resources °Organization         Address  Phone  Notes  °[facility]  (888) 581-9988   °Julie Brannon, PhD [address] [place], [place]   (336) 349-5553 or (336) 951-0000   °[facility]   [address] °[place], [place] (336) 349-4454   °[facility] [address], [place], [place] (336) 342-8316 Insurance/Medicaid/sponsorship through [facility]  °[facility] [address]                                    [place], [place] (336) 342-8316 Therapy/tele-psych/case    °[facility] [address]  ° [place], [place] (336) 349-2233    °Dr. Arfeen  (336) 349-4544   °[facility] [place]  [employer] [place] [facility] 1) [address],  °2) [address], [place] °3)  [address], [place] (336) 349-3220 °(336) 342-7768 ° °(336) 342-8140   °[place] [facility] (336) 342-1394 or (336) 342-3537 (After Hours)    ° ° °Take your usual prescriptions as previously directed.  Call your regular medical doctor today to schedule a follow up appointment within the next 2 days.  Return to the Emergency Department immediately sooner if worsening.  ° °

## 2015-06-03 NOTE — ED Provider Notes (Signed)
CSN: [id number]     Arrival date & time 06/03/15  0026 History   First MD Initiated Contact with Patient 06/03/15 0344     Chief Complaint  Patient presents with  . Chest Pain      HPI Pt was seen at 0350. Per pt, c/o gradual onset and persistence of constant chest "pain" and SOB that began at 2200 PTA. Pt states he "just hasn't been feeling well" for the past 2 to 3 days. Pt states his PMD started him on levaquin "after they swabbed my nose and said I had MRSA." Pt states he "thinks I had a panic attack tonight." States he took a xanax with partial improvement of his symptoms. Denies palpitations, no cough, no abd pain, no N/V/D, no back pain, no fevers, no rash, no calf/LE pain or unilateral swelling.    Past Medical History  Diagnosis Date  . Tenosynovitis of hand 02/2012    left  . Cough 03/11/2012  . Stuffy and runny nose 03/11/2012    clear drainage from nose  . Anxiety   . Panic attack    Past Surgical History  Procedure Laterality Date  . Wisdom tooth extraction      History  Substance Use Topics  . Smoking status: Never Smoker   . Smokeless tobacco: Never Used  . Alcohol Use: Yes     Comment: occasionally    Review of Systems ROS: Statement: All systems negative except as marked or noted in the HPI; Constitutional: Negative for fever and chills. ; ; Eyes: Negative for eye pain, redness and discharge. ; ; ENMT: Negative for ear pain, hoarseness, nasal congestion, sinus pressure and sore throat. ; ; Cardiovascular: +CP, SOB. Negative for palpitations, diaphoresis, and peripheral edema. ; ; Respiratory: Negative for cough, wheezing and stridor. ; ; Gastrointestinal: Negative for nausea, vomiting, diarrhea, abdominal pain, blood in stool, hematemesis, jaundice and rectal bleeding. . ; ; Genitourinary: Negative for dysuria, flank pain and hematuria. ; ; Musculoskeletal: Negative for back pain and neck pain. Negative for swelling and trauma.; ; Skin: Negative for pruritus, rash,  abrasions, blisters, bruising and skin lesion.; ; Neuro: Negative for headache, lightheadedness and neck stiffness. Negative for weakness, altered level of consciousness , altered mental status, extremity weakness, paresthesias, involuntary movement, seizure and syncope.; Psych:  +anxious. No SI, no SA, no HI, no hallucinations.    Allergies  Tramadol  Home Medications   Prior to Admission medications   Medication Sig Start Date End Date Taking? Authorizing Provider  ALPRAZolam Prudy Feeler) 0.5 MG tablet Take 0.5 mg by mouth daily as needed for sleep.    Historical Provider, MD  naproxen sodium (ANAPROX) 220 MG tablet Take 440 mg by mouth 2 (two) times daily with a meal.    Historical Provider, MD  sertraline (ZOLOFT) 100 MG tablet Take 100 mg by mouth daily.    Historical Provider, MD   BP 125/85 mmHg  Pulse 78  Temp(Src) 98.2 F (36.8 C) (Oral)  Resp 18  Ht  (1.93 m)  Wt 350 lb (158.759 kg)  BMI 42.62 kg/m2  SpO2 95%   Filed Vitals:   06/03/15 0345 06/03/15 0400 06/03/15 0430 06/03/15 0515  BP: 121/70 125/85 119/70 123/61  Pulse: 80 78 73 90  Temp:      TempSrc:      Resp: Height:      Weight:      SpO2: 92% 95% 94% 96%    Physical Exam  0454: Physical examination:  Nursing notes reviewed; Vital signs and O2 SAT reviewed;  Constitutional: Well developed, Well nourished, Well hydrated, In no acute distress; Head:  Normocephalic, atraumatic; Eyes: EOMI, PERRL, No scleral icterus; ENMT: Mouth and pharynx normal, Mucous membranes moist; Neck: Supple, Full range of motion, No lymphadenopathy; Cardiovascular: Regular rate and rhythm, No murmur, rub, or gallop; Respiratory: Breath sounds clear & equal bilaterally, No rales, rhonchi, wheezes.  Speaking full sentences with ease, Normal respiratory effort/excursion; Chest: Nontender, Movement normal; Abdomen: Soft, Nontender, Nondistended, Normal bowel sounds; Genitourinary: No CVA tenderness; Extremities: Pulses normal, No  tenderness, No edema, No calf edema or asymmetry.; Neuro: AA&Ox3, Major CN grossly intact.  Speech clear. No gross focal motor or sensory deficits in extremities.; Skin: Color normal, Warm, Dry.   ED Course  Procedures     EKG Interpretation   Date/Time:  Monday June 03 2015 00:32:59 EDT Ventricular Rate:  96 PR Interval:  168 QRS Duration: 84 QT Interval:  334 QTC Calculation: 421 R Axis:   79 Text Interpretation:  Normal sinus rhythm Normal ECG When compared with  ECG of 08/11/2013 No significant change was found Confirmed by [facility]   MD, Nicholos Johns [phone]) on 06/03/2015 4:34:40 AM      MDM  MDM Reviewed: previous chart, nursing note and vitals Reviewed previous: labs and ECG Interpretation: labs, ECG and x-ray      Results for orders placed or performed during the hospital encounter of 06/03/15  Basic metabolic panel  Result Value Ref Range   Sodium 135 135 - 145 mmol/L   Potassium 4.5 3.5 - 5.1 mmol/L   Chloride 100 (L) 101 - 111 mmol/L   CO2 26 22 - 32 mmol/L   Glucose, Bld 107 (H) 65 - 99 mg/dL   BUN 10 6 - 20 mg/dL   Creatinine, Ser 9.14 0.61 - 1.24 mg/dL   Calcium 9.5 8.9 - 78.2 mg/dL   GFR calc non Af Amer >60 >60 mL/min   GFR calc Af Amer >60 >60 mL/min   Anion gap 9 5 - 15  BNP (order ONLY if patient complains of dyspnea/SOB AND you have documented it for THIS visit)  Result Value Ref Range   B Natriuretic Peptide 6.0 0.0 - 100.0 pg/mL  Troponin I  Result Value Ref Range   Troponin I <0.03 <0.031 ng/mL  CBC with Differential  Result Value Ref Range   WBC 8.6 4.0 - 10.5 K/uL   RBC 5.28 4.22 - 5.81 MIL/uL   Hemoglobin 15.7 13.0 - 17.0 g/dL   HCT 95.6 21.3 - 08.6 %   MCV 84.8 78.0 - 100.0 fL   MCH 29.7 26.0 - 34.0 pg   MCHC 35.0 30.0 - 36.0 g/dL   RDW 57.8 46.9 - 62.9 %   Platelets 211 150 - 400 K/uL   Neutrophils Relative % 60 43 - 77 %   Neutro Abs 5.2 1.7 - 7.7 K/uL   Lymphocytes Relative 29 12 - 46 %   Lymphs Abs 2.5 0.7 - 4.0 K/uL    Monocytes Relative 10 3 - 12 %   Monocytes Absolute 0.8 0.1 - 1.0 K/uL   Eosinophils Relative 1 0 - 5 %   Eosinophils Absolute 0.1 0.0 - 0.7 K/uL   Basophils Relative 0 0 - 1 %   Basophils Absolute 0.0 0.0 - 0.1 K/uL  Troponin I  Result Value Ref Range   Troponin I <0.03 <0.031 ng/mL   Dg Chest 2 View 06/03/2015   CLINICAL  DATA:  Left and Mid chest pain since yesterday.  EXAM: CHEST  2 VIEW  COMPARISON:  08/11/2013  FINDINGS: The heart size and mediastinal contours are within normal limits. Both lungs are clear. The visualized skeletal structures are unremarkable.  IMPRESSION: No active cardiopulmonary disease.   Electronically Signed   By: Burman Nieves M.D.   On: 06/03/2015 01:48    0545:  Doubt PE as cause for symptoms with low risk [place].  Doubt ACS as cause for symptoms with normal troponin x2 and unchanged EKG from previous after 6+ hours of constant symptoms. VS remain stable. Unclear why he is taking abx; will have pt call his PMD this morning to clarify. Pt wants to go home now. Dx and testing d/w pt and family.  Questions answered.  Verb understanding, agreeable to d/c home with outpt f/u.    Samuel Jester, DO 06/06/15 1320

## 2015-06-03 NOTE — ED Notes (Signed)
The pt has panic attacks and he  Started having chest pain and sob 2 hours ago.  He took a xanax that did not help

## 2015-07-24 ENCOUNTER — Encounter (HOSPITAL_COMMUNITY): Payer: Self-pay | Admitting: Emergency Medicine

## 2015-07-24 ENCOUNTER — Emergency Department (HOSPITAL_COMMUNITY)
Admission: EM | Admit: 2015-07-24 | Discharge: 2015-07-24 | Disposition: A | Discharge status: Home / Self Care | Payer: 59 | Source: Home / Self Care

## 2015-07-24 DIAGNOSIS — R55 Syncope and collapse: Secondary | ICD-10-CM

## 2015-07-24 DIAGNOSIS — R002 Palpitations: Secondary | ICD-10-CM

## 2015-07-24 MED ORDER — ALPRAZOLAM 0.5 MG PO TABS: 0.5000 mg | ORAL_TABLET | Freq: Every evening | ORAL | Status: DC | PRN

## 2015-07-24 NOTE — Discharge Instructions (Signed)
We are referring you to see a cardiologist for your palpitations.    Your EKG looks good here.  Make sure you're drinking enough water.  If your symptoms return or you have chest pain, go to the ED.

## 2015-07-24 NOTE — ED Notes (Signed)
Lightheaded, anxiety, onset one week ago.  Patient has tried to reduce stress, diet, and exercise.

## 2015-07-24 NOTE — ED Notes (Signed)
Provided patient hard copy script

## 2015-07-24 NOTE — ED Provider Notes (Signed)
CSN: [id number]     Arrival date & time 07/24/15  1648 History   None    Chief Complaint  Patient presents with  . Dizziness   (Consider location/radiation/quality/duration/timing/severity/associated sxs/prior Treatment) HPI:  24 yo M presenting with about 1 month's worth of palpitations and lightheadedness. Started when prescribed Levaquin and omeprazole at that time.  States never had palpitations before those 2 medicines.  Stopped them about 1 week ago.  Symptoms have continued.  Describes both "fluttering" and "pounding" in chest in AM when arises and when trying to fall asleep.  Lightheaded occasionally when at work but never has to sit for this.  No N/V.  Occasonal dyspnea when palpitations occur.  No LE edema.  No chest pain.   Past Medical History  Diagnosis Date  . Tenosynovitis of hand 02/2012    left  . Cough 03/11/2012  . Stuffy and runny nose 03/11/2012    clear drainage from nose  . Anxiety   . Panic attack    Past Surgical History  Procedure Laterality Date  . Wisdom tooth extraction     No family history on file. History  Substance Use Topics  . Smoking status: Never Smoker   . Smokeless tobacco: Never Used  . Alcohol Use: Yes     Comment: occasionally    Review of Systems  Allergies  Tramadol  Home Medications   Prior to Admission medications   Medication Sig Start Date End Date Taking? Authorizing Provider  ALPRAZolam Prudy Feeler) 0.5 MG tablet Take 0.5 mg by mouth daily as needed for sleep.    Historical Provider, MD  ALPRAZolam Prudy Feeler) 0.5 MG tablet Take 1 tablet (0.5 mg total) by mouth at bedtime as needed for anxiety. 07/24/15   Tobey Grim, MD  naproxen sodium (ANAPROX) 220 MG tablet Take 440 mg by mouth 2 (two) times daily with a meal.    Historical Provider, MD  sertraline (ZOLOFT) 100 MG tablet Take 100 mg by mouth daily.    Historical Provider, MD   BP 134/91 mmHg  Pulse 91  Temp(Src) 98.3 F (36.8 C) (Oral)  Resp 16  SpO2 97% Physical Exam   Gen:  Alert, cooperative patient who appears stated age in no acute distress.  Vital signs reviewed. HEENT:  [place]/AT.  EOMI.  PERRL.  MMM.  Hypertrophic  Neck:  No thryomegaly Cardiac:  Regular rate and rhythm without murmur auscultated.  Good S1/S2. Pulm:  Clear to auscultation bilaterally with good air movement.  No wheezes or rales noted.   Abd:  Soft/nondistended/nontender.  Good bowel sounds throughout all four quadrants.  No masses noted.  Ext:  No LE edema.      ED Course  Procedures (including critical care time) Labs Review Labs Reviewed - No data to display  Imaging Review No results found.   MDM   1. Palpitations   2. Pre-syncope    - EKG done here.  NSR, no QTC prolongation.  No concerns for malignant arthymia.  - Can call to set up appt with cardiology if continues for loop recorder/prolonged monitoring.   -discussed likely anxiety component.  Refill for #15 Xanax.  FU with PCP.  - not truly orthostatic, but pulse did increase by 20 when standing.  Recommended increase fluid intake in the heat.   Tobey Grim, MD 07/24/15 [phone]

## 2015-09-23 ENCOUNTER — Encounter: Payer: Self-pay | Admitting: Cardiology

## 2015-10-07 DIAGNOSIS — G8929 Other chronic pain: Secondary | ICD-10-CM

## 2015-10-07 DIAGNOSIS — M25532 Pain in left wrist: Principal | ICD-10-CM

## 2015-10-07 HISTORY — DX: Other chronic pain: G89.29

## 2015-10-09 ENCOUNTER — Encounter: Payer: Self-pay | Admitting: Cardiology

## 2015-10-09 ENCOUNTER — Ambulatory Visit (INDEPENDENT_AMBULATORY_CARE_PROVIDER_SITE_OTHER): Payer: 59 | Admitting: Cardiology

## 2015-10-09 ENCOUNTER — Ambulatory Visit (INDEPENDENT_AMBULATORY_CARE_PROVIDER_SITE_OTHER): Payer: 59

## 2015-10-09 VITALS — BP 122/72 | HR 99 | Ht 76.0 in | Wt 377.0 lb

## 2015-10-09 DIAGNOSIS — R002 Palpitations: Secondary | ICD-10-CM

## 2015-10-09 DIAGNOSIS — R079 Chest pain, unspecified: Secondary | ICD-10-CM

## 2015-10-09 DIAGNOSIS — F410 Panic disorder [episodic paroxysmal anxiety] without agoraphobia: Secondary | ICD-10-CM

## 2015-10-09 NOTE — Progress Notes (Signed)
Cardiology Office Note   Date:  10/09/2015   ID:  Derrick Finlandhomas S Ure, DOB Oct 12, 1991, MRN [id number]  PCP:  Leanor RubensteinSUN,VYVYAN Y, MD    Chief Complaint  Patient presents with  . Palpitations  . Chest Pain      History of Present Illness: Derrick Hart is a 24 y.o. male who presents for evaluation of palpitation.  He has a history of palpitations remotely in the past.  He recently was seen by his PCP and stated that ever since his ENT treated him with Omeprazol and Levaquin for a MRSA sinusitis and GERD he has had palpitations.  On the 3rd day of the omeprazole and the first day of Levaquin he developed CP and palptiations.  He coughed hard and it resolved.  He went to the ER and was given Xanax.  His Levaquin was changed to Septra and PPI changed to Protonix and then dexilant.  He stopped the dexilant and his symptoms resovled except for occasional palpitaitons.  He feels like it is a skipped beat.  This has interfered some with his sleep.  He has a long history of anxiety.  He would get very lightheaded with his palpitations but no syncope.  He occasionally gets sharp chest pain that started after omeprazole.  It is very random and is nonexertional.  He describes it as a sharp pulsating pain that is off and on lasting a second to 5 minutes.  He denies any diaphoresis or nausea.  He has some DOE but he attributes this to anxiety and this occurs when he lays down.  He snores at night according to his girlfriend.  He has no daytime sleepiness.      Past Medical History  Diagnosis Date  . Tenosynovitis of hand 02/2012    left  . Cough 03/11/2012  . Stuffy and runny nose 03/11/2012    clear drainage from nose  . Anxiety   . Panic attack   . Chronic pain of left wrist 10/07/2015    Past Surgical History  Procedure Laterality Date  . Wisdom tooth extraction       Current Outpatient Prescriptions  Medication Sig Dispense Refill  . ALPRAZolam (XANAX) 0.5 MG tablet Take 0.5  mg by mouth daily as needed (anxiety attacks).     . naproxen sodium (ANAPROX) 220 MG tablet Take 440 mg by mouth 2 (two) times daily with a meal.    . pantoprazole (PROTONIX) 40 MG tablet Take 40 mg by mouth daily.    . sertraline (ZOLOFT) 100 MG tablet Take 100 mg by mouth daily.     No current facility-administered medications for this visit.    Allergies:   Tramadol    Social History:  The patient  reports that he has never smoked. He has never used smokeless tobacco. He reports that he drinks alcohol. He reports that he does not use illicit drugs.   Family History:  The patient's family history includes Anxiety disorder in his mother; Cancer in his paternal grandfather and paternal grandmother; Heart attack in his maternal grandfather and maternal grandmother; Hypertension in his father and mother; Stroke in his maternal grandfather.    ROS:  Please see the history of present illness.   Otherwise, review of systems are positive for none.   All other systems are reviewed and negative.    PHYSICAL EXAM: VS:  BP 122/72 mmHg  Pulse 99  Ht  (1.93 m)  Wt 377 lb (171.006 kg)  BMI 45.91 kg/m2  SpO2 97% , BMI Body mass index is 45.91 kg/(m^2). GEN: Well nourished, well developed, in no acute distress HEENT: normal Neck: no JVD, carotid bruits, or masses Cardiac: RRR; no murmurs, rubs, or gallops,no edema  Respiratory:  clear to auscultation bilaterally, normal work of breathing GI: soft, nontender, nondistended, + BS MS: no deformity or atrophy Skin: warm and dry, no rash Neuro:  Strength and sensation are intact Psych: euthymic mood, full affect   EKG:  EKG is not ordered today.    Recent Labs: 06/03/2015: B Natriuretic Peptide 6.0; BUN 10; Creatinine, Ser 0.94; Hemoglobin 15.7; Platelets 211; Potassium 4.5; Sodium 135    Lipid Panel No results found for: CHOL, TRIG, HDL, CHOLHDL, VLDL, LDLCALC, LDLDIRECT    Wt Readings from Last 3 Encounters:  10/09/15 377 lb  (171.006 kg)  06/03/15 350 lb (158.759 kg)  03/11/12 265 lb (120.203 kg)        ASSESSMENT AND PLAN:  1.  Palpitations with baseline EKG at PCP office showing NSR with sinus arrhythmia.  I will get a 30 day event monitor to assess further.   2.  Atypical sharp CP that does not sound cardiac in origin.  EKG is nonischemic. The pain is sharp with no associated symptoms.  I will get an ETT to rule out ischemia..  I will also check a 2D echo to assess LVF.   Current medicines are reviewed at length with the patient today.  The patient does not have concerns regarding medicines.  The following changes have been made:  no change  Labs/ tests ordered today: See above Assessment and Plan No orders of the defined types were placed in this encounter.     Disposition:   FU with me PRN pending results of study  Signed, Quintella Reichert, MD  10/09/2015 2:14 PM    [facility] [address] [place], [place], [place]  [address] Phone: [phone]; Fax: [phone]

## 2015-10-09 NOTE — Patient Instructions (Signed)
Medication Instructions:  Your physician recommends that you continue on your current medications as directed. Please refer to the Current Medication list given to you today.   Labwork: None  Testing/Procedures: Your physician has requested that you have an exercise tolerance test. For further information please visit [email or web address]. Please also follow instruction sheet, as given.   Your physician has requested that you have an echocardiogram. Echocardiography is a painless test that uses sound waves to create images of your heart. It provides your doctor with information about the size and shape of your heart and how well your heart's chambers and valves are working. This procedure takes approximately one hour. There are no restrictions for this procedure.  Your physician has recommended that you wear an event monitor. Event monitors are medical devices that record the heart's electrical activity. Doctors most often us these monitors to diagnose arrhythmias. Arrhythmias are problems with the speed or rhythm of the heartbeat. The monitor is a small, portable device. You can wear one while you do your normal daily activities. This is usually used to diagnose what is causing palpitations/syncope (passing out).  Follow-Up: Your physician recommends that you schedule a follow-up appointment S NEEDED with Dr. Mayford Knifeurner pending your results.  Any Other Special Instructions Will Be Listed Below (If Applicable).

## 2015-10-29 ENCOUNTER — Ambulatory Visit (HOSPITAL_COMMUNITY): Payer: 59 | Attending: Cardiovascular Disease

## 2015-10-29 ENCOUNTER — Encounter: Payer: 59 | Admitting: Cardiology

## 2015-10-29 ENCOUNTER — Other Ambulatory Visit: Payer: Self-pay

## 2015-10-29 ENCOUNTER — Ambulatory Visit (INDEPENDENT_AMBULATORY_CARE_PROVIDER_SITE_OTHER): Payer: 59

## 2015-10-29 DIAGNOSIS — I517 Cardiomegaly: Secondary | ICD-10-CM | POA: Insufficient documentation

## 2015-10-29 DIAGNOSIS — R079 Chest pain, unspecified: Secondary | ICD-10-CM

## 2015-10-31 ENCOUNTER — Telehealth: Payer: Self-pay | Admitting: Cardiology

## 2015-10-31 NOTE — Telephone Encounter (Signed)
Follow Up ° °Pt returning the call  °

## 2015-10-31 NOTE — Telephone Encounter (Signed)
Notes Recorded by Henrietta DineKathryn A Hassaan Crite, RN on 10/31/2015 at 4:50 PM Informed patient of results and verbal understanding expressed.

## 2015-11-01 LAB — EXERCISE TOLERANCE TEST
CHL CUP MPHR: 195 {beats}/min
CHL CUP RESTING HR STRESS: 73 {beats}/min
CHL RATE OF PERCEIVED EXERTION: 18
CSEPED: 7 min
CSEPEDS: 30 s
Estimated workload: 9.3 METS
Peak HR: 184 {beats}/min
Percent HR: 94 %

## 2015-11-04 ENCOUNTER — Telehealth: Payer: Self-pay

## 2015-11-04 DIAGNOSIS — I10 Essential (primary) hypertension: Secondary | ICD-10-CM

## 2015-11-04 NOTE — Telephone Encounter (Signed)
-----   Message from Quintella Reichertraci R Turner, MD sent at 11/02/2015  1:23 PM EST ----- Please let patient know that stress test was fine.  BP elevated during stress test - please get a 48 hour BP monitor to assess for HTN

## 2015-11-04 NOTE — Telephone Encounter (Signed)
Informed patient of results and verbal understanding expressed.  48 hour blood pressure cuff ordered for scheduling. Patient agrees with treatment plan.

## 2015-11-06 ENCOUNTER — Ambulatory Visit (INDEPENDENT_AMBULATORY_CARE_PROVIDER_SITE_OTHER): Payer: 59

## 2015-11-06 ENCOUNTER — Encounter: Payer: Self-pay | Admitting: *Deleted

## 2015-11-06 DIAGNOSIS — I10 Essential (primary) hypertension: Secondary | ICD-10-CM

## 2015-11-06 NOTE — Progress Notes (Signed)
Patient ID: Derrick Hart, male   DOB: 07-18-1991, 24 y.o.   MRN: [id number] 48 hour ambulatory blood pressure monitor applied to patient.

## 2015-11-18 ENCOUNTER — Telehealth: Payer: Self-pay | Admitting: Cardiology

## 2015-11-18 NOTE — Telephone Encounter (Signed)
-----   Message from Quintella Reichertraci R Turner, MD sent at 11/18/2015  3:25 PM EST ----- Normal rhythm with PACs and PVCs which are benign

## 2015-11-18 NOTE — Telephone Encounter (Signed)
New message     Calling to get event monitor results and bp monitor results

## 2015-11-18 NOTE — Telephone Encounter (Signed)
Informed patient of heart monitor results and verbal understanding expressed.   Informed patient that BP cuff results are not yet available. He understands he will be called when they are resulted.

## 2015-11-28 ENCOUNTER — Telehealth: Payer: Self-pay | Admitting: Cardiology

## 2015-11-28 NOTE — Telephone Encounter (Signed)
New message     patient calling back for bp monitor results

## 2015-11-28 NOTE — Telephone Encounter (Signed)
Informed patient that monitor results were received today and are awaiting Dr. Norris Crossurner's review. Patient grateful for call.

## 2015-11-29 NOTE — Telephone Encounter (Signed)
Per Dr. Mayford Knifeurner, informed patient that BP was stable. No new orders are being made. Instructed patient to limit salt intake and exercise a little more if he'd like his BP to decrease.

## 2015-12-03 ENCOUNTER — Encounter (HOSPITAL_COMMUNITY): Payer: Self-pay

## 2015-12-03 ENCOUNTER — Emergency Department (HOSPITAL_COMMUNITY)
Admission: EM | Admit: 2015-12-03 | Discharge: 2015-12-04 | Disposition: A | Discharge status: Home / Self Care | Payer: 59 | Attending: Emergency Medicine | Admitting: Emergency Medicine

## 2015-12-03 ENCOUNTER — Emergency Department (HOSPITAL_COMMUNITY): Payer: 59

## 2015-12-03 DIAGNOSIS — K219 Gastro-esophageal reflux disease without esophagitis: Secondary | ICD-10-CM

## 2015-12-03 DIAGNOSIS — Z8739 Personal history of other diseases of the musculoskeletal system and connective tissue: Secondary | ICD-10-CM | POA: Insufficient documentation

## 2015-12-03 DIAGNOSIS — R079 Chest pain, unspecified: Secondary | ICD-10-CM | POA: Diagnosis present

## 2015-12-03 DIAGNOSIS — F410 Panic disorder [episodic paroxysmal anxiety] without agoraphobia: Secondary | ICD-10-CM | POA: Diagnosis not present

## 2015-12-03 DIAGNOSIS — Z79899 Other long term (current) drug therapy: Secondary | ICD-10-CM | POA: Insufficient documentation

## 2015-12-03 DIAGNOSIS — G8929 Other chronic pain: Secondary | ICD-10-CM | POA: Insufficient documentation

## 2015-12-03 LAB — I-STAT CHEM 8, ED
BUN: 17 mg/dL (ref 6–20)
CHLORIDE: 101 mmol/L (ref 101–111)
CREATININE: 0.7 mg/dL (ref 0.61–1.24)
Calcium, Ion: 1.17 mmol/L (ref 1.12–1.23)
GLUCOSE: 107 mg/dL — AB (ref 65–99)
HEMATOCRIT: 44 % (ref 39.0–52.0)
HEMOGLOBIN: 15 g/dL (ref 13.0–17.0)
POTASSIUM: 4.2 mmol/L (ref 3.5–5.1)
Sodium: 139 mmol/L (ref 135–145)
TCO2: 25 mmol/L (ref 0–100)

## 2015-12-03 LAB — I-STAT TROPONIN, ED: Troponin i, poc: 0 ng/mL (ref 0.00–0.08)

## 2015-12-03 LAB — CBC
HEMATOCRIT: 42 % (ref 39.0–52.0)
HEMOGLOBIN: 14.3 g/dL (ref 13.0–17.0)
MCH: 29.2 pg (ref 26.0–34.0)
MCHC: 34 g/dL (ref 30.0–36.0)
MCV: 85.9 fL (ref 78.0–100.0)
Platelets: 185 10*3/uL (ref 150–400)
RBC: 4.89 MIL/uL (ref 4.22–5.81)
RDW: 12.6 % (ref 11.5–15.5)
WBC: 7.9 10*3/uL (ref 4.0–10.5)

## 2015-12-03 NOTE — ED Notes (Signed)
Pt complains of central chest pain that started today about 3pm, he describes it as a dull pain, no injury noted

## 2015-12-04 ENCOUNTER — Encounter (HOSPITAL_COMMUNITY): Payer: Self-pay | Admitting: Emergency Medicine

## 2015-12-04 MED ORDER — GI COCKTAIL ~~LOC~~
ORAL | Status: AC
  Filled 2015-12-04: qty 30

## 2015-12-04 MED ORDER — SUCRALFATE 1 GM/10ML PO SUSP: 1.0000 g | Freq: Three times a day (TID) | ORAL | Status: DC

## 2015-12-04 NOTE — ED Provider Notes (Signed)
CSN: [id number]     Arrival date & time 12/03/15  2000 History   By signing my name below, I, [name], attest that this documentation has been prepared under the direction and in the presence of [name], Derrick Hart.  Electronically Signed: Arlan OrganAshley Leger, ED Scribe. 12/04/2015. 2:45 AM.   Chief Complaint  Patient presents with  . Chest Pain   Patient is a 24 Hart.o. male presenting with chest pain. The history is provided by the patient. No language interpreter was used.  Chest Pain Pain location:  Substernal area Pain quality: dull   Pain radiates to:  Does not radiate Pain radiates to the back: no   Pain severity:  No pain Duration:  11 hours Timing:  Intermittent Progression:  Resolved Chronicity:  New Context: movement   Relieved by:  Rest Worsened by:  Nothing tried Ineffective treatments:  None tried Associated symptoms: anxiety   Associated symptoms: no abdominal pain, no back pain, no cough, no diaphoresis, no fever, no headache, no nausea, no palpitations and not vomiting   Risk factors: no Ehlers-Danlos syndrome     HPI Comments: Derrick Hart is a 24 Hart.o. male who presents to the Emergency Department complaining of intermittent, ongoing substernal chest pain onset 3 PM this afternoon. Pt states pain came on as he was getting ready to leave for work this evening. However, pt states pain has resolved and continuous to improve. Derrick Hart also reports intermittent feelings of "skipping beats". No aggravating or alleviating factors reported. No OTC medications or home remedies attempted prior to arrival. Pt is not currently prescribed any cardiac medications, however, pt states he takes a dose of Xanax daily. He denies any fever, chills, nausea, vomiting, cough, congestion, or itchy watery eyes. Pt has recently been on 3 rounds of antibiotic for his sinuses. Symptoms have not completely resolved and states intermittent chest pain episodes started shortly after starting  antibiotic medication. Derrick Hart is currently followed by Dr. Armanda Magicraci Turner. Pt has been placed on a 30 day monitor in the past for history of chest pain.  PCP: Derrick Hart,Derrick Hart, Derrick Hart    Past Medical History  Diagnosis Date  . Tenosynovitis of hand 02/2012    left  . Cough 03/11/2012  . Stuffy and runny nose 03/11/2012    clear drainage from nose  . Anxiety   . Panic attack   . Chronic pain of left wrist 10/07/2015   Past Surgical History  Procedure Laterality Date  . Wisdom tooth extraction     Family History  Problem Relation Age of Onset  . Hypertension Mother   . Anxiety disorder Mother   . Hypertension Father   . Heart attack Maternal Grandmother   . Heart attack Maternal Grandfather   . Stroke Maternal Grandfather   . Cancer Paternal Grandmother   . Cancer Paternal Grandfather    Social History  Substance Use Topics  . Smoking status: Never Smoker   . Smokeless tobacco: Never Used  . Alcohol Use: Yes     Comment: occasionally    Review of Systems  Constitutional: Negative for fever, chills and diaphoresis.  Respiratory: Negative for cough and wheezing.   Cardiovascular: Positive for chest pain. Negative for palpitations and leg swelling.  Gastrointestinal: Negative for nausea, vomiting and abdominal pain.  Genitourinary: Negative for dysuria.  Musculoskeletal: Negative for back pain.  Skin: Negative for rash.  Neurological: Negative for headaches.  Psychiatric/Behavioral: Negative for confusion.  All other systems reviewed and are negative.  Allergies  Tramadol  Home Medications   Prior to Admission medications   Medication Sig Start Date End Date Taking? Authorizing Provider  acetaminophen (TYLENOL) 500 MG tablet Take 1,000 mg by mouth every 6 (six) hours as needed for mild pain.   Yes Historical Provider, Derrick Hart  ALPRAZolam Prudy Feeler) 0.5 MG tablet Take 0.5 mg by mouth daily as needed (anxiety attacks).    Yes Historical Provider, Derrick Hart  bismuth subsalicylate  (PEPTO BISMOL) 262 MG/15ML suspension Take 30 mLs by mouth every 6 (six) hours as needed for indigestion.   Yes Historical Provider, Derrick Hart  naproxen sodium (ANAPROX) 220 MG tablet Take 440 mg by mouth 2 (two) times daily with a meal.   Yes Historical Provider, Derrick Hart  sertraline (ZOLOFT) 100 MG tablet Take 100 mg by mouth daily.   Yes Historical Provider, Derrick Hart   Triage Vitals: BP 124/76 mmHg  Pulse 69  Temp(Src) 98 F (36.7 C) (Oral)  Resp 12  SpO2 95%   Physical Exam  Constitutional: He is oriented to person, place, and time. He appears well-developed and well-nourished.  HENT:  Head: Normocephalic and atraumatic.  Mouth/Throat: Oropharynx is clear and moist. No oropharyngeal exudate.  Eyes: EOM are normal. Pupils are equal, round, and reactive to light.  Neck: Normal range of motion. Neck supple. No JVD present.  Trachea midline   Cardiovascular: Normal rate, regular rhythm, normal heart sounds and intact distal pulses.   Pulses:      Posterior tibial pulses are 2+ on the right side, and 2+ on the left side.  No carotid bruits   Pulmonary/Chest: Effort normal and breath sounds normal. No stridor. No respiratory distress. He has no wheezes. He has no rales. He exhibits no tenderness.  Abdominal: Soft. Bowel sounds are normal. He exhibits no distension and no mass. There is no tenderness. There is no rebound and no guarding.  Musculoskeletal: Normal range of motion. He exhibits no edema or tenderness.  Neurological: He is alert and oriented to person, place, and time. He has normal reflexes.  Skin: Skin is warm and dry.  Psychiatric: He has a normal mood and affect. Judgment normal.  Nursing note and vitals reviewed.   ED Course  Procedures (including critical care time)  DIAGNOSTIC STUDIES: Oxygen Saturation is 95% on RA, adequate by my interpretation.    COORDINATION OF CARE: 2:42 AM- Will order blood work and CXR. Will give GI cocktail. Discussed treatment plan with pt at bedside and  pt agreed to plan.     Labs Review Labs Reviewed  I-STAT CHEM 8, ED - Abnormal; Notable for the following:    Glucose, Bld 107 (*)    All other components within normal limits  CBC  I-STAT TROPOININ, ED    Imaging Review Dg Chest 2 View  12/03/2015  CLINICAL DATA:  Mid chest pain since 1500 hours today EXAM: CHEST  2 VIEW COMPARISON:  06/03/2015 FINDINGS: Normal heart size, mediastinal contours, and pulmonary vascularity. Lungs clear. No pneumothorax. Bones unremarkable. IMPRESSION: No acute abnormalities. Electronically Signed   By: Ulyses Southward M.D.   On: 12/03/2015 21:01   I have personally reviewed and evaluated these images and lab results as part of my medical decision-making.   EKG Interpretation   Date/Time:  Tuesday December 03 2015 20:07:32 EST Ventricular Rate:  101 PR Interval:  154 QRS Duration: 86 QT Interval:  333 QTC Calculation: 432 R Axis:   82 Text Interpretation:  Sinus tachycardia Confirmed by [facility]  Derrick Hart,  Carlos Heber ([id number])  on 12/04/2015 12:14:25 AM      MDM   Final diagnoses:  Gastroesophageal reflux disease without esophagitis   PERC negative wells 0 highly doubt PE  Pain gone post GI cocktail.  But as most of it gone prior to being seen this is likely anxiety and patient says his xanax no longer works  I personally performed the services described in this documentation, which was scribed in my presence. The recorded information has been reviewed and is accurate.      Cy Blamer, Derrick Hart 12/04/15 [phone]

## 2015-12-11 ENCOUNTER — Telehealth: Payer: Self-pay | Admitting: Cardiology

## 2015-12-11 DIAGNOSIS — R0683 Snoring: Secondary | ICD-10-CM

## 2015-12-11 NOTE — Telephone Encounter (Signed)
New message  Pt called to discuss possible sleep study. Please call

## 2015-12-11 NOTE — Telephone Encounter (Signed)
OK to order split night PSG

## 2015-12-11 NOTE — Telephone Encounter (Signed)
Sleep study ordered for precert/scheduling. Patient understands Toma CopierBethany, CMA, will call him to schedule when precert goes through.

## 2015-12-11 NOTE — Telephone Encounter (Signed)
Patient went to the dentist yesterday and the dentist told him to contact Dr. Mayford Knifeurner because he saw exhibited symptoms of sleep apnea.  He recommends Dr. Mayford Knifeurner order a sleep study and potentially get a mouth guard after - depending on severity of OSA.  To Dr. Mayford Knifeurner.

## 2015-12-12 NOTE — Telephone Encounter (Signed)
Submitted paperwork to begin pre-cert.  Once approved I will call patient and schedule.   Per Katy's note below, patient is aware.

## 2016-02-18 ENCOUNTER — Ambulatory Visit (HOSPITAL_BASED_OUTPATIENT_CLINIC_OR_DEPARTMENT_OTHER): Payer: 59 | Attending: Cardiology

## 2016-02-18 VITALS — Ht 76.0 in | Wt 365.0 lb

## 2016-02-18 DIAGNOSIS — R0683 Snoring: Secondary | ICD-10-CM | POA: Insufficient documentation

## 2016-02-18 DIAGNOSIS — Z79899 Other long term (current) drug therapy: Secondary | ICD-10-CM | POA: Insufficient documentation

## 2016-02-18 DIAGNOSIS — G4733 Obstructive sleep apnea (adult) (pediatric): Secondary | ICD-10-CM | POA: Diagnosis not present

## 2016-02-19 ENCOUNTER — Encounter (HOSPITAL_BASED_OUTPATIENT_CLINIC_OR_DEPARTMENT_OTHER): Payer: Self-pay

## 2016-02-19 ENCOUNTER — Telehealth: Payer: Self-pay | Admitting: Cardiology

## 2016-02-19 DIAGNOSIS — G4733 Obstructive sleep apnea (adult) (pediatric): Secondary | ICD-10-CM

## 2016-02-19 HISTORY — DX: Obstructive sleep apnea (adult) (pediatric): G47.33

## 2016-02-19 NOTE — Telephone Encounter (Signed)
Please let patient know that they have mild sleep apnea and recommend OV to discuss treatment options.

## 2016-02-19 NOTE — Sleep Study (Cosign Needed)
   Patient Name: Derrick Hart, Derrick Hart MRN: [id number] Study Date: 02/18/2016 Gender: Male D.O.B: 1991-06-07 Age (years): 25 Referring Provider: Armanda Magic MD, ABSM Interpreting Physician: Armanda Magic MD, ABSM RPSGT: Wylie Hail  BMI: 44 Weight (lbs): 360 Height (inches): 76 Neck Size: 19.50  CLINICAL INFORMATION Sleep Study Type: NPSG Indication for sleep study: Snoring  SLEEP STUDY TECHNIQUE As per the AASM Manual for the Scoring of Sleep and Associated Events v2.3 (April 2016) with a hypopnea requiring 4% desaturations. The channels recorded and monitored were frontal, central and occipital EEG, electrooculogram (EOG), submentalis EMG (chin), nasal and oral airflow, thoracic and abdominal wall motion, anterior tibialis EMG, snore microphone, electrocardiogram, and pulse oximetry.  MEDICATIONS Patient's medications include: Xanax, Naproxen, Zoloft, Carafate. Medications self-administered by patient during sleep study : No sleep medicine administered.  SLEEP ARCHITECTURE The study was initiated at 10:51:33 PM and ended at 5:06:36 AM. Sleep onset time was 7.8 minutes and the sleep efficiency was 94.6%. The total sleep time was 354.7 minutes. Stage REM latency was 100.0 minutes. The patient spent 7.19% of the night in stage N1 sleep, 66.17% in stage N2 sleep, 11.28% in stage N3 and 15.36% in REM. Alpha intrusion was absent. Supine sleep was 66.54%.  RESPIRATORY PARAMETERS The overall apnea/hypopnea index (AHI) was 9.5 per hour. There were 1 total apneas, including 0 obstructive, 1 central and 0 mixed apneas. There were 55 hypopneas and 12 RERAs. The AHI during Stage REM sleep was 14.3 per hour. AHI while supine was 7.6 per hour. The mean oxygen saturation was 92.58%. The minimum SpO2 during sleep was 86.00%. Loud snoring was noted during this study.  CARDIAC DATA The 2 lead EKG demonstrated sinus rhythm. The mean heart rate was 69.76 beats per minute. Other EKG findings  include: None.  LEG MOVEMENT DATA The total PLMS were 0 with a resulting PLMS index of 0.00. Associated arousal with leg movement index was 0.0 .  DIAGNOSIS - Obstructive Sleep Apnea (327.23 [G47.33 ICD-10]) - Snoring  RECOMMENDATIONS - Treatment options could include therapeutic CPAP titration to determine optimal pressure required to alleviate sleep disordered breathing, referral to ENT to evaluate for surgical causes of sleep apnea and snoring or referral for oral device. - Avoid alcohol, sedatives and other CNS depressants that may worsen sleep apnea and disrupt normal sleep architecture. - Sleep hygiene should be reviewed to assess factors that may improve sleep quality. - Weight management and regular exercise should be initiated or continued if appropriate. - Followup with sleep specialist to discuss treatement options.    Quintella Reichert Diplomate, American Board of Sleep Medicine  ELECTRONICALLY SIGNED ON:  02/19/2016, 1:57 PM [facility] PH: [phone]   FX: [phone] ACCREDITED BY THE AMERICAN ACADEMY OF SLEEP MEDICINE

## 2016-02-21 NOTE — Telephone Encounter (Signed)
Patient informed of results.  Stated verbal understanding.   Asked patient about scheduling an appointment and he said he would have to wait until he got his calendar so he would call back to schedule the appointment

## 2016-02-25 ENCOUNTER — Ambulatory Visit: Payer: Self-pay | Admitting: Allergy and Immunology

## 2016-03-09 ENCOUNTER — Encounter (INDEPENDENT_AMBULATORY_CARE_PROVIDER_SITE_OTHER): Payer: Self-pay

## 2016-03-09 ENCOUNTER — Encounter: Payer: Self-pay | Admitting: Allergy and Immunology

## 2016-03-09 ENCOUNTER — Ambulatory Visit (INDEPENDENT_AMBULATORY_CARE_PROVIDER_SITE_OTHER): Payer: 59 | Admitting: Allergy and Immunology

## 2016-03-09 VITALS — BP 130/80 | HR 90 | Temp 97.8°F | Resp 18 | Ht 75.59 in | Wt 364.9 lb

## 2016-03-09 DIAGNOSIS — R0600 Dyspnea, unspecified: Secondary | ICD-10-CM | POA: Insufficient documentation

## 2016-03-09 DIAGNOSIS — J3089 Other allergic rhinitis: Secondary | ICD-10-CM

## 2016-03-09 DIAGNOSIS — R12 Heartburn: Secondary | ICD-10-CM | POA: Diagnosis not present

## 2016-03-09 MED ORDER — RANITIDINE HCL 150 MG PO TABS: 150.0000 mg | ORAL_TABLET | Freq: Two times a day (BID) | ORAL | Status: DC

## 2016-03-09 MED ORDER — AZELASTINE-FLUTICASONE 137-50 MCG/ACT NA SUSP: 1.0000 | Freq: Two times a day (BID) | NASAL | Status: DC | PRN

## 2016-03-09 NOTE — Assessment & Plan Note (Signed)
Derrick Hart has perceived side effects associated with proton pump inhibitor use in the past.  Therefore, we will encourage left eye modifications and will start an H2 receptor antagonist.  Appropriate lifestyle modifications have been discussed and provided in written form.  Weight loss has been encouraged.  A prescription has been provided for ranitidine 150 mg twice a day.  If symptoms persist or progress, gastroenterology evaluation may be warranted.

## 2016-03-09 NOTE — Assessment & Plan Note (Signed)
Dyspnea. The patient's sensation of air-hunger, not being able to get a full breath on inspiration, which is relieved by a yawn suggests sighing dyspnea. Other less likely etiologies include vocal cord dysfunction and asthma. The patient's spirometry today was normal.  Diaphragmatic breathing, or belly breathing, has been discussed with the patient as this technique often times relieves sighing dyspnea.  The patient's subjective and objective measures a pulmonary function will be followed and the treatment plan will be adjusted accordingly. We will recheck spirometry on the next visit.

## 2016-03-09 NOTE — Patient Instructions (Addendum)
Allergic rhinitis Perennial and seasonal allergic rhinitis with a possible nonallergic component  Aeroallergen avoidance measures have been discussed and provided in written form.  A prescription has been provided for Dymista (azelastine/fluticasone) nasal spray, 1 spray per nostril twice daily as needed. Proper nasal spray technique has been discussed and demonstrated.  Nasal saline lavage (NeilMed) as needed has been recommended along with instructions for proper administration.  Guaifenesin 1200 mg (plus/minus pseudoephedrine 120 mg) twice daily as needed with adequate hydration. Pseudoephedrine is only to be used for short-term relief of nasal/sinus congestion. Long-term use is discouraged due to potential side effects.   Dyspnea Dyspnea. The patient's sensation of air-hunger, not being able to get a full breath on inspiration, which is relieved by a yawn suggests sighing dyspnea. Other less likely etiologies include vocal cord dysfunction and asthma. The patient's spirometry today was normal.  Diaphragmatic breathing, or belly breathing, has been discussed with the patient as this technique often times relieves sighing dyspnea.  The patient's subjective and objective measures a pulmonary function will be followed and the treatment plan will be adjusted accordingly. We will recheck spirometry on the next visit.  Heartburn Kaevion has perceived side effects associated with proton pump inhibitor use in the past.  Therefore, we will encourage left eye modifications and will start an H2 receptor antagonist.  Appropriate lifestyle modifications have been discussed and provided in written form.  Weight loss has been encouraged.  A prescription has been provided for ranitidine 150 mg twice a day.  If symptoms persist or progress, gastroenterology evaluation may be warranted.    Return in about 4 months (around 07/09/2016), or if symptoms worsen or fail to improve.  Reducing Pollen  Exposure  The American Academy of Allergy, Asthma and Immunology suggests the following steps to reduce your exposure to pollen during allergy seasons.    1. Do not hang sheets or clothing out to dry; pollen may collect on these items. 2. Do not mow lawns or spend time around freshly cut grass; mowing stirs up pollen. 3. Keep windows closed at night.  Keep car windows closed while driving. 4. Minimize morning activities outdoors, a time when pollen counts are usually at their highest. 5. Stay indoors as much as possible when pollen counts or humidity is high and on windy days when pollen tends to remain in the air longer. 6. Use air conditioning when possible.  Many air conditioners have filters that trap the pollen spores. 7. Use a HEPA room air filter to remove pollen form the indoor air you breathe.   Control of Mold Allergen  Mold and fungi can grow on a variety of surfaces provided certain temperature and moisture conditions exist.  Outdoor molds grow on plants, decaying vegetation and soil.  The major outdoor mold, Alternaria and Cladosporium, are found in very high numbers during hot and dry conditions.  Generally, a late Summer - Fall peak is seen for common outdoor fungal spores.  Rain will temporarily lower outdoor mold spore count, but counts rise rapidly when the rainy period ends.  The most important indoor molds are Aspergillus and Penicillium.  Dark, humid and poorly ventilated basements are ideal sites for mold growth.  The next most common sites of mold growth are the bathroom and the kitchen.  Outdoor [employer] 2. Use air conditioning and keep windows closed 3. Avoid exposure to decaying vegetation. 4. Avoid leaf raking. 5. Avoid grain handling. 6. Consider wearing a face mask if working in moldy areas.  Indoor Mold Control 2. Maintain humidity below 50%. 3. Clean washable surfaces with 5% bleach solution. 4. Remove sources e.g. Contaminated carpets.   Lifestyle  Changes for Controlling GERD  When you have GERD, stomach acid feels as if it's backing up toward your mouth. Whether or not you take medication to control your GERD, your symptoms can often be improved with lifestyle changes.   Raise Your Head  Reflux is more likely to strike when you're lying down flat, because stomach fluid can  flow backward more easily. Raising the head of your bed 4-6 inches can help. To do this:  Slide blocks or books under the legs at the head of your bed. Or, place a wedge under  the mattress. Many foam stores can make a suitable wedge for you. The wedge  should run from your waist to the top of your head.  Don't just prop your head on several pillows. This increases pressure on your  stomach. It can make GERD worse.  Watch Your Eating Habits Certain foods may increase the acid in your stomach or relax the lower esophageal sphincter, making GERD more likely. It's best to avoid the following:  Coffee, tea, and carbonated drinks (with and without caffeine)  Fatty, fried, or spicy food  Mint, chocolate, onions, and tomatoes  Any other foods that seem to irritate your stomach or cause you pain  Relieve the Pressure  Eat smaller meals, even if you have to eat more often.  Don't lie down right after you eat. Wait a few hours for your stomach to empty.  Avoid tight belts and tight-fitting clothes.  Lose excess weight.  Tobacco and Alcohol  Avoid smoking tobacco and drinking alcohol. They can make GERD symptoms worse.

## 2016-03-09 NOTE — Assessment & Plan Note (Addendum)
Perennial and seasonal allergic rhinitis with a possible nonallergic component  Aeroallergen avoidance measures have been discussed and provided in written form.  A prescription has been provided for Dymista (azelastine/fluticasone) nasal spray, 1 spray per nostril twice daily as needed. Proper nasal spray technique has been discussed and demonstrated.  Nasal saline lavage (NeilMed) as needed has been recommended along with instructions for proper administration.  Guaifenesin 1200 mg (plus/minus pseudoephedrine 120 mg) twice daily as needed with adequate hydration. Pseudoephedrine is only to be used for short-term relief of nasal/sinus congestion. Long-term use is discouraged due to potential side effects.

## 2016-03-09 NOTE — Progress Notes (Signed)
New Patient Note  RE: Derrick Hart MRN: [id number] DOB: Jul 26, 1991 Date of Office Visit: 03/09/2016  Referring provider: Deatra James, MD Primary care provider: Leanor Rubenstein, MD  Chief Complaint: Nasal Congestion; Sinus Problem; and Breathing Problem   History of present illness: HPI Comments: Derrick Hart is a 25 y.o. male who presents today for consultation of rhinosinusitis.  He reports that over the past 3 years he has experienced nasal congestion, thick postnasal drainage, throat clearing, rhinorrhea, itchy/watery eyes, and occasional sinus pressure.  The nasal and sinus symptoms occur year round without any specific identified triggers, the ocular symptoms tend to be more severe in the springtime.  These symptoms have become progressively more frequent and severe over the past 3 years.  Over the past 12 months he has had 3 sinus infections requiring antibiotics.  At his place of work he is exposed to chemicals and fumes and he is uncertain if these are contributing to his nasal symptoms.  In the past he has been prescribed 3 different proton pump inhibitors for presumed reflux, however he has had to discontinue these medications due to perceived side effects.  He still experiences occasional heartburn and chest pain.  He reports that he has had a negative cardiac workup.  He is concerned that the heartburn may be due to food allergies.  He complains of shortness breath, typically 2 or 3 times per week. Specifically, he reports that he has difficulty achieving a deep satisfying yawn.  He has no difficulty with exhalation.  He denies wheezing. On occasion, he is able to attain relief by a "successful" yawn but the air hunger returns shortly thereafter. His symptoms increase with emotional stress and are inducible by suggestion.    Assessment and plan: Allergic rhinitis Perennial and seasonal allergic rhinitis with a possible nonallergic component  Aeroallergen avoidance measures have  been discussed and provided in written form.  A prescription has been provided for Dymista (azelastine/fluticasone) nasal spray, 1 spray per nostril twice daily as needed. Proper nasal spray technique has been discussed and demonstrated.  Nasal saline lavage (NeilMed) as needed has been recommended along with instructions for proper administration.  Guaifenesin 1200 mg (plus/minus pseudoephedrine 120 mg) twice daily as needed with adequate hydration. Pseudoephedrine is only to be used for short-term relief of nasal/sinus congestion. Long-term use is discouraged due to potential side effects.   Dyspnea Dyspnea. The patient's sensation of air-hunger, not being able to get a full breath on inspiration, which is relieved by a yawn suggests sighing dyspnea. Other less likely etiologies include vocal cord dysfunction and asthma. The patient's spirometry today was normal.  Diaphragmatic breathing, or belly breathing, has been discussed with the patient as this technique often times relieves sighing dyspnea.  The patient's subjective and objective measures a pulmonary function will be followed and the treatment plan will be adjusted accordingly. We will recheck spirometry on the next visit.  Heartburn [name] has perceived side effects associated with proton pump inhibitor use in the past.  Therefore, we will encourage left eye modifications and will start an H2 receptor antagonist.  Appropriate lifestyle modifications have been discussed and provided in written form.  Weight loss has been encouraged.  A prescription has been provided for ranitidine 150 mg twice a day.  If symptoms persist or progress, gastroenterology evaluation may be warranted.   Diagnositics: Spirometry:  Normal with an FEV1 of 91% predicted.  Please see scanned spirometry results for details. Epicutaneous testing: Positive to tree pollen. Intradermal  testing: Positive to molds.    Physical examination: Blood pressure  130/80, pulse 90, temperature 97.8 F (36.6 C), temperature source Oral, resp. rate 18, height 6' 3.59" (1.92 m), weight 364 lb 13.8 oz (165.5 kg).  General: Alert, interactive, in no acute distress. HEENT: TMs pearly gray, turbinates edematous without discharge, post-pharynx erythematous. Neck: Supple without lymphadenopathy. Lungs: Clear to auscultation without wheezing, rhonchi or rales. CV: Normal S1, S2 without murmurs. Abdomen: Nondistended, nontender. Skin: Warm and dry, without lesions or rashes. Extremities:  No clubbing, cyanosis or edema. Neuro:   Grossly intact.  Review of systems:  Review of Systems  Constitutional: Negative for fever, chills and weight loss.  HENT: Positive for congestion. Negative for nosebleeds.   Eyes: Negative for blurred vision.  Respiratory: Positive for shortness of breath. Negative for hemoptysis and wheezing.   Cardiovascular: Negative for chest pain.  Gastrointestinal: Negative for diarrhea and constipation.  Genitourinary: Negative for dysuria.  Musculoskeletal: Negative for myalgias and joint pain.  Skin: Negative for itching and rash.  Neurological: Positive for headaches. Negative for dizziness.  Endo/Heme/Allergies: Positive for environmental allergies. Does not bruise/bleed easily.    Past medical history:  Past Medical History  Diagnosis Date  . Tenosynovitis of hand 02/2012    left  . Cough 03/11/2012  . Stuffy and runny nose 03/11/2012    clear drainage from nose  . Anxiety   . Panic attack   . Chronic pain of left wrist 10/07/2015  . OSA (obstructive sleep apnea) 02/19/2016    Mild with AHI 9.4/hr    Past surgical history:  Past Surgical History  Procedure Laterality Date  . Wisdom tooth extraction      Family history: Family History  Problem Relation Age of Onset  . Hypertension Mother   . Anxiety disorder Mother   . Allergic rhinitis Mother   . Hypertension Father   . Heart attack Maternal Grandmother   . Heart  attack Maternal Grandfather   . Stroke Maternal Grandfather   . Cancer Paternal Grandmother   . Cancer Paternal Grandfather   . Asthma Brother   . Allergic rhinitis Brother   . Angioedema Neg Hx   . Atopy Neg Hx   . Eczema Neg Hx   . Immunodeficiency Neg Hx   . Urticaria Neg Hx     Social history: Social History   Social History  . Marital Status: Single    Spouse Name: N/A  . Number of Children: N/A  . Years of Education: N/A   Occupational History  . Not on file.   Social History Main Topics  . Smoking status: Never Smoker   . Smokeless tobacco: Never Used  . Alcohol Use: Yes     Comment: occasionally  . Drug Use: No  . Sexual Activity: Not on file   Other Topics Concern  . Not on file   Social History Narrative   Environmental History: The patient lives in an apartment with carpeting in the bedroom, gas heat, and central air.  There is a dog in the apartment which has access to his bedroom.  He is a nonsmoker.  He is exposed to fumes, chemicals, and/or dust at work.    Medication List       This list is accurate as of: 03/09/16  5:24 PM.  Always use your most recent med list.               acetaminophen 500 MG tablet  Commonly known as:  TYLENOL  Take 1,000 mg by mouth every 6 (six) hours as needed for mild pain.     ALPRAZolam 0.5 MG tablet  Commonly known as:  XANAX  Take 0.5 mg by mouth daily as needed (anxiety attacks).     Azelastine-Fluticasone 137-50 MCG/ACT Susp  Place 1 spray into the nose 2 (two) times daily as needed.     bismuth subsalicylate 262 MG/15ML suspension  Commonly known as:  PEPTO BISMOL  Take 30 mLs by mouth every 6 (six) hours as needed for indigestion. Reported on 03/09/2016     naproxen sodium 220 MG tablet  Commonly known as:  ANAPROX  Take 440 mg by mouth 2 (two) times daily with a meal. Reported on 03/09/2016     ranitidine 150 MG tablet  Commonly known as:  ZANTAC  Take 1 tablet (150 mg total) by mouth 2 (two) times  daily.     sertraline 100 MG tablet  Commonly known as:  ZOLOFT  Take 100 mg by mouth daily. Reported on 03/09/2016     sucralfate 1 GM/10ML suspension  Commonly known as:  CARAFATE  Take 10 mLs (1 g total) by mouth 4 (four) times daily -  with meals and at bedtime.        Known medication allergies: Allergies  Allergen Reactions  . Tramadol Shortness Of Breath    I appreciate the opportunity to take part in this Osmany's care. Please do not hesitate to contact me with questions.  Sincerely,   R. Jorene Guestarter Chrishawn Boley, MD

## 2016-03-16 ENCOUNTER — Encounter: Payer: Self-pay | Admitting: *Deleted

## 2016-03-17 ENCOUNTER — Telehealth: Payer: Self-pay

## 2016-03-17 NOTE — Telephone Encounter (Signed)
I called and spoke with the patient and informed him that if he was having one alcoholic beverage that is probably okay to continue with ranitidine, however if he was planning on drinking more than that he should hold the ranitidine.  He verbalized understanding and stated that he will plan to hold the ranitidine prior to consuming alcohol.

## 2016-03-17 NOTE — Telephone Encounter (Signed)
Pt would like to see if he can stop taking ranitidine. He is having a party and alcohol will be involved. Pt seen online that he should take alcohol with ranitidine.   Please Advise  Thanks  DOL VISIT 03/09/2016 Bobbitt

## 2016-03-17 NOTE — Telephone Encounter (Signed)
PER DR BOBBITT PT CAN TAKE RANITIDINE WITH ALCOHOL. LM FOR PT TO CALL OFFICE

## 2016-03-17 NOTE — Telephone Encounter (Signed)
Please advise 

## 2016-04-22 ENCOUNTER — Encounter: Payer: Self-pay | Admitting: Cardiology

## 2016-04-22 ENCOUNTER — Ambulatory Visit (INDEPENDENT_AMBULATORY_CARE_PROVIDER_SITE_OTHER): Payer: 59 | Admitting: Cardiology

## 2016-04-22 VITALS — BP 100/64 | HR 84 | Ht 76.0 in | Wt 371.4 lb

## 2016-04-22 DIAGNOSIS — I493 Ventricular premature depolarization: Secondary | ICD-10-CM | POA: Diagnosis not present

## 2016-04-22 DIAGNOSIS — R0683 Snoring: Secondary | ICD-10-CM | POA: Insufficient documentation

## 2016-04-22 DIAGNOSIS — R079 Chest pain, unspecified: Secondary | ICD-10-CM | POA: Diagnosis not present

## 2016-04-22 DIAGNOSIS — G4733 Obstructive sleep apnea (adult) (pediatric): Secondary | ICD-10-CM

## 2016-04-22 DIAGNOSIS — E669 Obesity, unspecified: Secondary | ICD-10-CM

## 2016-04-22 HISTORY — DX: Ventricular premature depolarization: I49.3

## 2016-04-22 HISTORY — DX: Obesity, unspecified: E66.9

## 2016-04-22 HISTORY — DX: Snoring: R06.83

## 2016-04-22 NOTE — Progress Notes (Signed)
Cardiology Office Note    Date:  04/22/2016   ID:  Derrick Hart, DOB 1991-04-03, MRN [id number]  PCP:  Leanor Rubenstein, MD  Cardiologist:  Quintella Reichert, MD   Chief Complaint  Patient presents with  . Sleep Apnea  . Chest Pain    History of Present Illness:  Derrick Hart is a 25 y.o. male presents for followup of palpitations. He has a history of palpitations remotely in the past. He recently was seen by his PCP and stated that ever since his ENT treated him with Omeprazol and Levaquin for a MRSA sinusitis and GERD he has had palpitations. On the 3rd day of the omeprazole and the first day of Levaquin he developed CP and palptiations. He coughed hard and it resolved. He went to the ER and was given Xanax. His Levaquin was changed to Septra and PPI changed to Protonix and then dexilant. He stopped the dexilant and his symptoms resovled except for occasional palpitaitons. He feels like it is a skipped beat. This has interfered some with his sleep. He has a long history of anxiety. He would get very lightheaded with his palpitations but no syncope. He occasionally gets sharp chest pain that started after omeprazole. It is very random and is nonexertional. He describes it as a sharp pulsating pain that is off and on lasting a second to 5 minutes. He denies any diaphoresis or nausea. He has some DOE but he attributes this to anxiety and this occurs when he lays down. He snores at night according to his girlfriend. He has no daytime sleepiness. ETT showed no ischemia and 2D echo was normal.  Event monitor showed PACs and PVCs. He still has random sharp CP that his PCP thinks is due to anxiety.  He still has occasional skipped beats from his PVCs and PACs.   Due to snoring he underwent sleep study which showed mild OSA with an AHI of 9.5/hr with oxygen desaturations as low as 86% with loud snoring.  He is now here to discuss his sleep study. He says that he feels rested about 50% of  the time when he gets up.  He has minimal daytime sleepiness and does not ever have to take a nap during the day. He has chronic sinus issues and has a deviated nasal septum and follows with ENT who felt he may need surgery at some time.        Past Medical History  Diagnosis Date  . Tenosynovitis of hand 02/2012    left  . Cough 03/11/2012  . Stuffy and runny nose 03/11/2012    clear drainage from nose  . Anxiety   . Panic attack   . Chronic pain of left wrist 10/07/2015  . OSA (obstructive sleep apnea) 02/19/2016    Mild with AHI 9.4/hr  . PVC (premature ventricular contraction) 04/22/2016  . Snoring 04/22/2016  . Obesity (BMI 30-39.9) 04/22/2016    Past Surgical History  Procedure Laterality Date  . Wisdom tooth extraction      Current Medications: Outpatient Prescriptions Prior to Visit  Medication Sig Dispense Refill  . acetaminophen (TYLENOL) 500 MG tablet Take 1,000 mg by mouth every 6 (six) hours as needed for mild pain.    Marland Kitchen ALPRAZolam (XANAX) 0.5 MG tablet Take 0.5 mg by mouth daily as needed (anxiety attacks).     . bismuth subsalicylate (PEPTO BISMOL) 262 MG/15ML suspension Take 30 mLs by mouth every 6 (six) hours as needed for indigestion. Reported on  03/09/2016    . naproxen sodium (ANAPROX) 220 MG tablet Take 440 mg by mouth 2 (two) times daily with a meal. Reported on 03/09/2016    . ranitidine (ZANTAC) 150 MG tablet Take 1 tablet (150 mg total) by mouth 2 (two) times daily. 60 tablet 5  . Azelastine-Fluticasone 137-50 MCG/ACT SUSP Place 1 spray into the nose 2 (two) times daily as needed. (Patient not taking: Reported on 04/22/2016) 1 Bottle 5  . sertraline (ZOLOFT) 100 MG tablet Take 100 mg by mouth daily. Reported on 04/22/2016    . sucralfate (CARAFATE) 1 GM/10ML suspension Take 10 mLs (1 g total) by mouth 4 (four) times daily -  with meals and at bedtime. (Patient not taking: Reported on 03/09/2016) 420 mL 0   No facility-administered medications prior to visit.      Allergies:   Tramadol   Social History   Social History  . Marital Status: Single    Spouse Name: N/A  . Number of Children: N/A  . Years of Education: N/A   Social History Main Topics  . Smoking status: Never Smoker   . Smokeless tobacco: Former Neurosurgeon    Quit date: 02/19/2015  . Alcohol Use: 0.0 oz/week    0 Standard drinks or equivalent per week     Comment: occasionally  . Drug Use: No  . Sexual Activity: Not Asked   Other Topics Concern  . None   Social History Narrative     Family History:  The patient's family history includes Allergic rhinitis in his brother and mother; Anxiety disorder in his mother; Asthma in his brother; Cancer in his paternal grandfather and paternal grandmother; Heart attack in his maternal grandfather and maternal grandmother; Hypertension in his father and mother; Stroke in his maternal grandfather. There is no history of Angioedema, Atopy, Eczema, Immunodeficiency, or Urticaria.   ROS:   Please see the history of present illness.    ROS All other systems reviewed and are negative.   PHYSICAL EXAM:   VS:  BP 100/64 mmHg  Pulse 84  Ht 6\' 4"  (1.93 m)  Wt 371 lb 6.4 oz (168.466 kg)  BMI 45.23 kg/m2   GEN: Well nourished, well developed, in no acute distress HEENT: normal Neck: no JVD, carotid bruits, or masses Cardiac: RRR; no murmurs, rubs, or gallops,no edema.  Intact distal pulses bilaterally.  Respiratory:  clear to auscultation bilaterally, normal work of breathing GI: soft, nontender, nondistended, + BS MS: no deformity or atrophy Skin: warm and dry, no rash Neuro:  Alert and Oriented x 3, Strength and sensation are intact Psych: euthymic mood, full affect  Wt Readings from Last 3 Encounters:  04/22/16 371 lb 6.4 oz (168.466 kg)  03/09/16 364 lb 13.8 oz (165.5 kg)  02/18/16 365 lb (165.563 kg)      Studies/Labs Reviewed:   EKG:  EKG is not ordered today.   Recent Labs: 06/03/2015: B Natriuretic Peptide 6.0 12/03/2015:  BUN 17; Creatinine, Ser 0.70; Hemoglobin 15.0; Platelets 185; Potassium 4.2; Sodium 139   Lipid Panel No results found for: CHOL, TRIG, HDL, CHOLHDL, VLDL, LDLCALC, LDLDIRECT  Additional studies/ records that were reviewed today include:  Sleep study, echo and stress test    ASSESSMENT:    1. OSA (obstructive sleep apnea)   2. PVC (premature ventricular contraction)   3. Snoring   4. Chest pain, unspecified chest pain type   5. Obesity (BMI 30-39.9)      PLAN:  In order of problems listed above:  1. OSA - this is mild and he does not have much daytime sleepiness.  His main problems is snoring.  We have discussed treatment options and I have recommended that we proceed with referral to Dr. Toni ArthursFuller for oral device fitting.  I do not think that CPAP is warrented at this time given that he has no significant oxygen desaturations and no significant daytime sleepiness.   2. PVCs - I reassured him that with a normal ETT and echo that these are benign. 3. Snoring - I suspect that this is due to deviated nasal septum as well as nasal congestion and encouraged him to followup with his ENT.  He has a very large neck which is likely resulting in some sleep apnea and snoring as well.  Hopefully the oral device will help relieve some of the snoring.  He needs significant weight loss which we talked about as well. 4. Chest pain - this is very atypical and most likely secondary to GERD and anxiety.  No further cardiac workup with normal ETT recently. 5. Obesity - I have encouraged him to get into a routine exercise program and cut back on carbs and portions.     Medication Adjustments/Labs and Tests Ordered: Current medicines are reviewed at length with the patient today.  Concerns regarding medicines are outlined above.  Medication changes, Labs and Tests ordered today are listed in the Patient Instructions below.   Harlon FlorSigned, Journiee Feldkamp R, MD  04/22/2016 11:49 AM    [facility]  [facility] [address], [place], [place]  [address] Phone: [phone]; Fax: [phone]

## 2016-04-22 NOTE — Patient Instructions (Signed)
Medication Instructions:  Your physician recommends that you continue on your current medications as directed. Please refer to the Current Medication list given to you today.   Labwork: None  Testing/Procedures: None  Follow-Up: You have been referred to Dr. Toni ArthursFuller for evaluation for an oral device.  Your physician wants you to follow-up in: 6 months with Dr. Mayford Knifeurner. You will receive a reminder letter in the mail two months in advance. If you don't receive a letter, please call our office to schedule the follow-up appointment.   Any Other Special Instructions Will Be Listed Below (If Applicable).     If you need a refill on your cardiac medications before your next appointment, please call your pharmacy.

## 2016-05-20 ENCOUNTER — Other Ambulatory Visit: Payer: Self-pay

## 2016-05-20 DIAGNOSIS — R12 Heartburn: Secondary | ICD-10-CM

## 2016-05-21 ENCOUNTER — Other Ambulatory Visit: Payer: Self-pay | Admitting: *Deleted

## 2016-05-21 DIAGNOSIS — R12 Heartburn: Secondary | ICD-10-CM

## 2016-05-21 MED ORDER — RANITIDINE HCL 150 MG PO TABS: 150.0000 mg | ORAL_TABLET | Freq: Two times a day (BID) | ORAL | Status: DC

## 2016-06-15 ENCOUNTER — Telehealth: Payer: Self-pay | Admitting: Pediatrics

## 2016-06-15 NOTE — Telephone Encounter (Signed)
Patient called in payment for $175.00 today, 06/15/2016. Asked to have an updated itemized statement to be mailed. Stated he has not been receiving these updated statements bu t has received a letter stating that he is past due. Pt said he will be able to pay remaining balance at a later date.

## 2016-06-15 NOTE — Telephone Encounter (Signed)
Sent copy of last statement & printout showing current bal

## 2016-07-14 ENCOUNTER — Encounter (HOSPITAL_COMMUNITY): Payer: Self-pay | Admitting: Emergency Medicine

## 2016-07-14 ENCOUNTER — Ambulatory Visit (HOSPITAL_COMMUNITY)
Admission: EM | Admit: 2016-07-14 | Discharge: 2016-07-14 | Disposition: A | Discharge status: Home / Self Care | Payer: 59 | Attending: Family Medicine | Admitting: Family Medicine

## 2016-07-14 DIAGNOSIS — K2100 Gastro-esophageal reflux disease with esophagitis, without bleeding: Secondary | ICD-10-CM

## 2016-07-14 HISTORY — DX: Gastro-esophageal reflux disease without esophagitis: K21.9

## 2016-07-14 MED ORDER — GI COCKTAIL ~~LOC~~
30.0000 mL | Freq: Once | ORAL | Status: AC
  Administered 2016-07-14: 30 mL via ORAL

## 2016-07-14 MED ORDER — GI COCKTAIL ~~LOC~~
ORAL | Status: AC
  Filled 2016-07-14: qty 30

## 2016-07-14 NOTE — Discharge Instructions (Signed)
See your doctor today as scheduled. Plans for gastroent refferal should be made.

## 2016-07-14 NOTE — ED Provider Notes (Signed)
[facility]    CSN: [id number] Arrival date & time: 07/14/16  1108  First Provider Contact:  First MD Initiated Contact with Patient 07/14/16 1253        History   Chief Complaint Chief Complaint  Patient presents with  . Chest Pain    PT attributes pain to gastric reflux    HPI Derrick Hart is a 25 y.o. male.    Abdominal Pain  Pain location:  Epigastric Pain quality: sharp   Pain radiates to:  Chest Pain severity:  Moderate Onset quality:  Gradual Duration:  3 days Progression:  Worsening Chronicity:  Chronic Context: eating   Context comment:  Has seen sev specialists ,not gastroe, mult meds rx without permanent relief of sx. Ineffective treatments:  Antacids Associated symptoms: melena   Associated symptoms: no diarrhea, no fever, no nausea and no vomiting   Associated symptoms comment:  Black specs seen in stool for sev days.   Past Medical History:  Diagnosis Date  . Anxiety   . Chronic pain of left wrist 10/07/2015  . Cough 03/11/2012  . GERD (gastroesophageal reflux disease)   . Obesity (BMI 30-39.9) 04/22/2016  . OSA (obstructive sleep apnea) 02/19/2016   Mild with AHI 9.4/hr  . Panic attack   . PVC (premature ventricular contraction) 04/22/2016  . Snoring 04/22/2016  . Stuffy and runny nose 03/11/2012   clear drainage from nose  . Tenosynovitis of hand 02/2012   left    Patient Active Problem List   Diagnosis Date Noted  . PVC (premature ventricular contraction) 04/22/2016  . Snoring 04/22/2016  . Chest pain 04/22/2016  . Obesity (BMI 30-39.9) 04/22/2016  . Allergic rhinitis 03/09/2016  . Heartburn 03/09/2016  . OSA (obstructive sleep apnea) 02/19/2016  . Chronic pain of left wrist 10/07/2015  . Panic attack 08/11/2013    Past Surgical History:  Procedure Laterality Date  . WISDOM TOOTH EXTRACTION         Home Medications    Prior to Admission medications   Medication Sig Start Date End Date Taking? Authorizing Provider    acetaminophen (TYLENOL) 500 MG tablet Take 1,000 mg by mouth every 6 (six) hours as needed for mild pain.    Historical Provider, MD  ALPRAZolam Prudy Feeler) 0.5 MG tablet Take 0.5 mg by mouth daily as needed (anxiety attacks).     Historical Provider, MD  bismuth subsalicylate (PEPTO BISMOL) 262 MG/15ML suspension Take 30 mLs by mouth every 6 (six) hours as needed for indigestion. Reported on 03/09/2016    Historical Provider, MD  naproxen sodium (ANAPROX) 220 MG tablet Take 440 mg by mouth 2 (two) times daily with a meal. Reported on 03/09/2016    Historical Provider, MD  ranitidine (ZANTAC) 150 MG tablet Take 1 tablet (150 mg total) by mouth 2 (two) times daily. 05/21/16   Cristal Ford, MD  sertraline (ZOLOFT) 50 MG tablet Take 50 mg by mouth daily.    Historical Provider, MD    Family History Family History  Problem Relation Age of Onset  . Hypertension Mother   . Anxiety disorder Mother   . Allergic rhinitis Mother   . Hypertension Father   . Heart attack Maternal Grandmother   . Heart attack Maternal Grandfather   . Stroke Maternal Grandfather   . Cancer Paternal Grandmother   . Cancer Paternal Grandfather   . Asthma Brother   . Allergic rhinitis Brother   . Angioedema Neg Hx   . Atopy Neg Hx   .  Eczema Neg Hx   . Immunodeficiency Neg Hx   . Urticaria Neg Hx     Social History Social History  Substance Use Topics  . Smoking status: Never Smoker  . Smokeless tobacco: Former Neurosurgeon    Quit date: 02/19/2015  . Alcohol use No     Comment: occasionally     Allergies   Tramadol   Review of Systems Review of Systems  Constitutional: Negative.  Negative for fever.  HENT: Negative.   Gastrointestinal: Positive for abdominal pain, blood in stool and melena. Negative for diarrhea, nausea and vomiting.  All other systems reviewed and are negative.    Physical Exam Triage Vital Signs ED Triage Vitals  Enc Vitals Group     BP 07/14/16 1231 127/91     Pulse Rate 07/14/16  1231 85     Resp 07/14/16 1231 16     Temp 07/14/16 1231 98.5 F (36.9 C)     Temp Source 07/14/16 1231 Oral     SpO2 07/14/16 1231 96 %     Weight --      Height --      Head Circumference --      Peak Flow --      Pain Score 07/14/16 1232 7     Pain Loc --      Pain Edu? --      Excl. in GC? --    No data found.   Updated Vital Signs BP 127/91 (BP Location: Right Arm)   Pulse 85   Temp 98.5 F (36.9 C) (Oral)   Resp 16   SpO2 96%   Visual Acuity Right Eye Distance:   Left Eye Distance:   Bilateral Distance:    Right Eye Near:   Left Eye Near:    Bilateral Near:     Physical Exam  Cardiovascular: Normal rate, regular rhythm, normal heart sounds and intact distal pulses.   Pulmonary/Chest: Effort normal and breath sounds normal. He exhibits no tenderness.  Abdominal: Soft. Bowel sounds are normal. There is tenderness in the epigastric area. There is no rigidity, no rebound, no guarding and no CVA tenderness. No hernia.  Genitourinary: Rectal exam shows guaiac positive stool.  Nursing note and vitals reviewed.    UC Treatments / Results  Labs (all labs ordered are listed, but only abnormal results are displayed) Labs Reviewed - No data to display  EKG  EKG Interpretation None       Radiology No results found.  Procedures Procedures (including critical care time)  Medications Ordered in UC Medications - No data to display   Initial Impression / Assessment and Plan / UC Course  I have reviewed the triage vital signs and the nursing notes.  Pertinent labs & imaging results that were available during my care of the patient were reviewed by me and considered in my medical decision making (see chart for details).  Clinical Course      Final Clinical Impressions(s) / UC Diagnoses   Final diagnoses:  None    New Prescriptions New Prescriptions   No medications on file     Linna Hoff, MD 07/14/16 1324

## 2016-07-14 NOTE — ED Triage Notes (Signed)
PT reports central chest pain that is burning and "tearing". PT reports issues with gastric relfux over the last 6 months. PT takes Zantac twice a day. PT has tried additional antacids this AM with no relief. PT also reports he has noticed black streaks in his stool for the last 3 days.

## 2018-12-22 DIAGNOSIS — R42 Dizziness and giddiness: Secondary | ICD-10-CM | POA: Diagnosis not present

## 2018-12-22 DIAGNOSIS — K219 Gastro-esophageal reflux disease without esophagitis: Secondary | ICD-10-CM | POA: Diagnosis not present

## 2018-12-22 DIAGNOSIS — F419 Anxiety disorder, unspecified: Secondary | ICD-10-CM | POA: Diagnosis not present

## 2019-09-28 DIAGNOSIS — K219 Gastro-esophageal reflux disease without esophagitis: Secondary | ICD-10-CM | POA: Diagnosis not present

## 2019-09-28 DIAGNOSIS — F411 Generalized anxiety disorder: Secondary | ICD-10-CM | POA: Diagnosis not present

## 2019-09-28 DIAGNOSIS — G4733 Obstructive sleep apnea (adult) (pediatric): Secondary | ICD-10-CM | POA: Diagnosis not present

## 2020-03-14 ENCOUNTER — Ambulatory Visit: Payer: Self-pay | Attending: Internal Medicine

## 2020-03-14 DIAGNOSIS — Z23 Encounter for immunization: Secondary | ICD-10-CM

## 2020-03-14 NOTE — Progress Notes (Signed)
   Covid-19 Vaccination Clinic  Name:  Derrick Hart    MRN: [id number] DOB: [birth year]  03/14/2020  Derrick Hart was observed post Covid-19 immunization for 15 minutes without incident. He was provided with Vaccine Information Sheet and instruction to access the V-Safe system.   Derrick Hart was instructed to call 911 with any severe reactions post vaccine: Marland Kitchen Difficulty breathing  . Swelling of face and throat  . A fast heartbeat  . A bad rash all over body  . Dizziness and weakness   Immunizations Administered    Name Date Dose VIS Date Route   Pfizer COVID-19 Vaccine 03/14/2020  8:18 AM 0.3 mL 12/01/2019 Intramuscular   Manufacturer: [employer], [employer]   Lot: [id number]   NDC: [address]-9

## 2020-03-26 DIAGNOSIS — L814 Other melanin hyperpigmentation: Secondary | ICD-10-CM | POA: Diagnosis not present

## 2020-03-26 DIAGNOSIS — D229 Melanocytic nevi, unspecified: Secondary | ICD-10-CM | POA: Diagnosis not present

## 2020-03-26 DIAGNOSIS — D1801 Hemangioma of skin and subcutaneous tissue: Secondary | ICD-10-CM | POA: Diagnosis not present

## 2020-03-26 DIAGNOSIS — L819 Disorder of pigmentation, unspecified: Secondary | ICD-10-CM | POA: Diagnosis not present

## 2020-04-08 ENCOUNTER — Ambulatory Visit: Payer: Self-pay | Attending: Internal Medicine

## 2020-04-08 DIAGNOSIS — Z23 Encounter for immunization: Secondary | ICD-10-CM

## 2020-04-08 NOTE — Progress Notes (Signed)
   Covid-19 Vaccination Clinic  Name:  Derrick Hart    MRN: [id number] DOB: 01-16-1991  04/08/2020  [name] was observed post Covid-19 immunization for 15 minutes without incident. He was provided with Vaccine Information Sheet and instruction to access the V-Safe system.   [name] was instructed to call 911 with any severe reactions post vaccine: Marland Kitchen Difficulty breathing  . Swelling of face and throat  . A fast heartbeat  . A bad rash all over body  . Dizziness and weakness   Immunizations Administered    Name Date Dose VIS Date Route   Pfizer COVID-19 Vaccine 04/08/2020  8:17 AM 0.3 mL 02/14/2019 Intramuscular   Manufacturer: [employer], [employer]   Lot: [id number]   NDC: [address]-9

## 2020-06-12 DIAGNOSIS — R0683 Snoring: Secondary | ICD-10-CM | POA: Diagnosis not present

## 2020-06-12 DIAGNOSIS — R0681 Apnea, not elsewhere classified: Secondary | ICD-10-CM | POA: Diagnosis not present

## 2020-06-12 DIAGNOSIS — G4719 Other hypersomnia: Secondary | ICD-10-CM | POA: Diagnosis not present

## 2020-07-05 DIAGNOSIS — G4733 Obstructive sleep apnea (adult) (pediatric): Secondary | ICD-10-CM | POA: Diagnosis not present

## 2020-07-05 DIAGNOSIS — K219 Gastro-esophageal reflux disease without esophagitis: Secondary | ICD-10-CM | POA: Diagnosis not present

## 2020-07-05 DIAGNOSIS — F411 Generalized anxiety disorder: Secondary | ICD-10-CM | POA: Diagnosis not present

## 2020-07-08 DIAGNOSIS — G4733 Obstructive sleep apnea (adult) (pediatric): Secondary | ICD-10-CM | POA: Diagnosis not present

## 2020-08-02 DIAGNOSIS — G4733 Obstructive sleep apnea (adult) (pediatric): Secondary | ICD-10-CM | POA: Diagnosis not present

## 2020-09-24 DIAGNOSIS — F411 Generalized anxiety disorder: Secondary | ICD-10-CM | POA: Diagnosis not present

## 2020-09-24 DIAGNOSIS — R109 Unspecified abdominal pain: Secondary | ICD-10-CM | POA: Diagnosis not present

## 2020-09-24 DIAGNOSIS — Z13220 Encounter for screening for lipoid disorders: Secondary | ICD-10-CM | POA: Diagnosis not present

## 2020-10-17 DIAGNOSIS — R7989 Other specified abnormal findings of blood chemistry: Secondary | ICD-10-CM | POA: Diagnosis not present

## 2020-10-25 ENCOUNTER — Other Ambulatory Visit: Payer: Self-pay | Admitting: Physician Assistant

## 2020-10-25 DIAGNOSIS — K219 Gastro-esophageal reflux disease without esophagitis: Secondary | ICD-10-CM | POA: Diagnosis not present

## 2020-10-25 DIAGNOSIS — R1013 Epigastric pain: Secondary | ICD-10-CM

## 2020-10-25 DIAGNOSIS — R0789 Other chest pain: Secondary | ICD-10-CM | POA: Diagnosis not present

## 2020-10-25 DIAGNOSIS — R7401 Elevation of levels of liver transaminase levels: Secondary | ICD-10-CM | POA: Diagnosis not present

## 2020-10-29 ENCOUNTER — Ambulatory Visit
Admission: RE | Admit: 2020-10-29 | Discharge: 2020-10-29 | Disposition: A | Discharge status: Home / Self Care | Payer: BC Managed Care – PPO | Source: Ambulatory Visit | Attending: Physician Assistant | Admitting: Physician Assistant

## 2020-10-29 DIAGNOSIS — R1013 Epigastric pain: Secondary | ICD-10-CM | POA: Diagnosis not present

## 2020-11-06 ENCOUNTER — Ambulatory Visit
Admission: RE | Admit: 2020-11-06 | Discharge: 2020-11-06 | Disposition: A | Discharge status: Home / Self Care | Payer: BC Managed Care – PPO | Source: Ambulatory Visit | Attending: Physician Assistant | Admitting: Physician Assistant

## 2020-11-06 DIAGNOSIS — K760 Fatty (change of) liver, not elsewhere classified: Secondary | ICD-10-CM | POA: Diagnosis not present

## 2020-11-06 DIAGNOSIS — R7401 Elevation of levels of liver transaminase levels: Secondary | ICD-10-CM

## 2020-11-11 DIAGNOSIS — G4733 Obstructive sleep apnea (adult) (pediatric): Secondary | ICD-10-CM | POA: Diagnosis not present

## 2021-02-12 DIAGNOSIS — G4733 Obstructive sleep apnea (adult) (pediatric): Secondary | ICD-10-CM | POA: Diagnosis not present

## 2021-03-07 DIAGNOSIS — G4733 Obstructive sleep apnea (adult) (pediatric): Secondary | ICD-10-CM | POA: Diagnosis not present

## 2021-03-07 DIAGNOSIS — G44209 Tension-type headache, unspecified, not intractable: Secondary | ICD-10-CM | POA: Diagnosis not present

## 2021-03-07 DIAGNOSIS — F419 Anxiety disorder, unspecified: Secondary | ICD-10-CM | POA: Diagnosis not present

## 2021-03-07 DIAGNOSIS — J302 Other seasonal allergic rhinitis: Secondary | ICD-10-CM | POA: Diagnosis not present

## 2021-04-16 DIAGNOSIS — J029 Acute pharyngitis, unspecified: Secondary | ICD-10-CM | POA: Diagnosis not present

## 2021-05-10 ENCOUNTER — Encounter (HOSPITAL_COMMUNITY): Payer: Self-pay | Admitting: Physician Assistant

## 2021-05-10 ENCOUNTER — Ambulatory Visit (HOSPITAL_COMMUNITY)
Admission: EM | Admit: 2021-05-10 | Discharge: 2021-05-10 | Disposition: A | Discharge status: Home / Self Care | Payer: BC Managed Care – PPO | Attending: Physician Assistant | Admitting: Physician Assistant

## 2021-05-10 ENCOUNTER — Other Ambulatory Visit: Payer: Self-pay

## 2021-05-10 DIAGNOSIS — T148XXA Other injury of unspecified body region, initial encounter: Secondary | ICD-10-CM

## 2021-05-10 DIAGNOSIS — L255 Unspecified contact dermatitis due to plants, except food: Secondary | ICD-10-CM

## 2021-05-10 MED ORDER — MUPIROCIN 2 % EX OINT: 1.0000 "application " | TOPICAL_OINTMENT | Freq: Two times a day (BID) | CUTANEOUS | 0 refills | Status: DC

## 2021-05-10 NOTE — ED Triage Notes (Signed)
Pt has blister on Lt foot between toes. Pt reports recent poison IVy

## 2021-05-10 NOTE — Discharge Instructions (Addendum)
Try not to pop blisters.  Once they do open you can use Bactroban twice daily.  Keep them covered.  Look for signs of infection if that occurs please return for reevaluation.

## 2021-05-10 NOTE — ED Provider Notes (Signed)
[facility]    CSN: [id number] Arrival date & time: 05/10/21  1737      History   Chief Complaint Chief Complaint  Patient presents with  . Blister    HPI Derrick Hart is a 30 y.o. male.   Patient presents today with a several day history of blisters on his left second and third toes.  He reports symptoms began after he was weed eating barefoot and developed a severe case of poison ivy.  Generally this has been improving and he denies any significant pruritus but has noticed these enlarging blisters.  They have been stable for 24 hours.  He denies any significant pain.  He has purposely not applied any medication or popped blisters.  He denies history of diabetes or immunosuppression.  Denies history of recurrent skin infections.  Denies any recent antibiotic use.     Past Medical History:  Diagnosis Date  . Anxiety   . Chronic pain of left wrist 10/07/2015  . Cough 03/11/2012  . GERD (gastroesophageal reflux disease)   . Obesity (BMI 30-39.9) 04/22/2016  . OSA (obstructive sleep apnea) 02/19/2016   Mild with AHI 9.4/hr  . Panic attack   . PVC (premature ventricular contraction) 04/22/2016  . Snoring 04/22/2016  . Stuffy and runny nose 03/11/2012   clear drainage from nose  . Tenosynovitis of hand 02/2012   left    Patient Active Problem List   Diagnosis Date Noted  . PVC (premature ventricular contraction) 04/22/2016  . Snoring 04/22/2016  . Chest pain 04/22/2016  . Obesity (BMI 30-39.9) 04/22/2016  . Allergic rhinitis 03/09/2016  . Heartburn 03/09/2016  . OSA (obstructive sleep apnea) 02/19/2016  . Chronic pain of left wrist 10/07/2015  . Panic attack 08/11/2013    Past Surgical History:  Procedure Laterality Date  . WISDOM TOOTH EXTRACTION         Home Medications    Prior to Admission medications   Medication Sig Start Date End Date Taking? Authorizing Provider  mupirocin ointment (BACTROBAN) 2 % Apply 1 application topically 2 (two) times  daily. 05/10/21  Yes Jemell Town, Noberto Retort, PA-C  acetaminophen (TYLENOL) 500 MG tablet Take 1,000 mg by mouth every 6 (six) hours as needed for mild pain.    [provider]  ALPRAZolam Prudy Feeler) 0.5 MG tablet Take 0.5 mg by mouth daily as needed (anxiety attacks).     [provider]  bismuth subsalicylate (PEPTO BISMOL) 262 MG/15ML suspension Take 30 mLs by mouth every 6 (six) hours as needed for indigestion. Reported on 03/09/2016    [provider]  naproxen sodium (ANAPROX) 220 MG tablet Take 440 mg by mouth 2 (two) times daily with a meal. Reported on 03/09/2016    [provider]  ranitidine (ZANTAC) 150 MG tablet Take 1 tablet (150 mg total) by mouth 2 (two) times daily. 05/21/16   Bobbitt, Heywood Iles, MD  sertraline (ZOLOFT) 50 MG tablet Take 50 mg by mouth daily.    [provider]    Family History Family History  Problem Relation Age of Onset  . Hypertension Mother   . Anxiety disorder Mother   . Allergic rhinitis Mother   . Hypertension Father   . Heart attack Maternal Grandmother   . Heart attack Maternal Grandfather   . Stroke Maternal Grandfather   . Cancer Paternal Grandmother   . Cancer Paternal Grandfather   . Asthma Brother   . Allergic rhinitis Brother   . Angioedema Neg Hx   .  Atopy Neg Hx   . Eczema Neg Hx   . Immunodeficiency Neg Hx   . Urticaria Neg Hx     Social History Social History   Tobacco Use  . Smoking status: Never Smoker  . Smokeless tobacco: Former Engineer, water Use Topics  . Alcohol use: No    Alcohol/week: 0.0 standard drinks    Comment: occasionally  . Drug use: No     Allergies   Tramadol   Review of Systems Review of Systems  Constitutional: Negative for activity change, appetite change, fatigue and fever.  Respiratory: Negative for cough and shortness of breath.   Cardiovascular: Negative for chest pain.  Gastrointestinal: Negative for abdominal pain, diarrhea, nausea and vomiting.   Skin: Positive for rash and wound. Negative for color change.  Neurological: Negative for dizziness, light-headedness and headaches.     Physical Exam Triage Vital Signs ED Triage Vitals [05/10/21 1820]  Enc Vitals Group     BP 138/89     Pulse Rate 93     Resp 20     Temp 98.7 F (37.1 C)     Temp Source Oral     SpO2 98 %     Weight      Height      Head Circumference      Peak Flow      Pain Score      Pain Loc      Pain Edu?      Excl. in GC?    No data found.  Updated Vital Signs BP 138/89   Pulse 93   Temp 98.7 F (37.1 C) (Oral)   Resp 20   SpO2 98%   Visual Acuity Right Eye Distance:   Left Eye Distance:   Bilateral Distance:    Right Eye Near:   Left Eye Near:    Bilateral Near:     Physical Exam Vitals reviewed.  Constitutional:      General: He is awake.     Appearance: Normal appearance. He is normal weight. He is not ill-appearing.     Comments: Very pleasant male appears stated age in no acute distress  HENT:     Head: Normocephalic and atraumatic.  Cardiovascular:     Rate and Rhythm: Normal rate and regular rhythm.     Heart sounds: No murmur heard.   Pulmonary:     Effort: Pulmonary effort is normal.     Breath sounds: Normal breath sounds. No stridor. No wheezing, rhonchi or rales.     Comments: Clear to auscultation bilaterally Skin:    Findings: Rash present. Rash is macular, papular and vesicular.     Comments: Widespread maculopapular rash with fine vesicles noted bilateral lower extremities with 2 cm bulla on left lateral second toe and left medial third toe.  No surrounding erythema, bleeding, drainage, streaking.  Neurological:     Mental Status: He is alert.  Psychiatric:        Behavior: Behavior is cooperative.      UC Treatments / Results  Labs (all labs ordered are listed, but only abnormal results are displayed) Labs Reviewed - No data to display  EKG   Radiology No results  found.  Procedures Procedures (including critical care time)  Medications Ordered in UC Medications - No data to display  Initial Impression / Assessment and Plan / UC Course  I have reviewed the triage vital signs and the nursing notes.  Pertinent labs & imaging results that were  available during my care of the patient were reviewed by me and considered in my medical decision making (see chart for details).     No evidence of infection.  Patient was instructed not to puncture bulla and allow them to heal on their own.  Discussed that once he is ruptured he should keep area clean.  He was prescribed Bactroban to be used once these are open to prevent infection.  Discussed signs/symptoms of infection that warrant reevaluation.  Encouraged him to continue with topical medications to manage pruritus associated with Rhus dermatitis.   Strict return precautions given to which patient expressed understanding.  Final Clinical Impressions(s) / UC Diagnoses   Final diagnoses:  Blister  Rhus dermatitis     Discharge Instructions     Try not to pop blisters.  Once they do open you can use Bactroban twice daily.  Keep them covered.  Look for signs of infection if that occurs please return for reevaluation.    ED Prescriptions    Medication Sig Dispense Auth. Provider   mupirocin ointment (BACTROBAN) 2 % Apply 1 application topically 2 (two) times daily. 22 g Kerston Landeck K, PA-C     PDMP not reviewed this encounter.   Jeani Hawking, PA-C 05/10/21 1932

## 2021-05-15 DIAGNOSIS — L237 Allergic contact dermatitis due to plants, except food: Secondary | ICD-10-CM | POA: Diagnosis not present

## 2021-07-04 DIAGNOSIS — U071 COVID-19: Secondary | ICD-10-CM | POA: Diagnosis not present

## 2021-08-01 ENCOUNTER — Encounter (HOSPITAL_COMMUNITY): Payer: Self-pay | Admitting: Emergency Medicine

## 2021-08-01 ENCOUNTER — Other Ambulatory Visit: Payer: Self-pay

## 2021-08-01 ENCOUNTER — Ambulatory Visit (HOSPITAL_COMMUNITY)
Admission: EM | Admit: 2021-08-01 | Discharge: 2021-08-01 | Disposition: A | Discharge status: Home / Self Care | Payer: BC Managed Care – PPO | Attending: Physician Assistant | Admitting: Physician Assistant

## 2021-08-01 DIAGNOSIS — R519 Headache, unspecified: Secondary | ICD-10-CM

## 2021-08-01 DIAGNOSIS — H539 Unspecified visual disturbance: Secondary | ICD-10-CM

## 2021-08-01 MED ORDER — BACLOFEN 10 MG PO TABS: 10.0000 mg | ORAL_TABLET | Freq: Two times a day (BID) | ORAL | 0 refills | Status: DC | PRN

## 2021-08-01 MED ORDER — NAPROXEN 500 MG PO TABS: 500.0000 mg | ORAL_TABLET | Freq: Two times a day (BID) | ORAL | 0 refills | Status: DC

## 2021-08-01 NOTE — ED Triage Notes (Addendum)
Patient noticed "seeing spots" in left eye starting yesterday with a right sided headache and mild dizziness. Tried using tylenol and aspirin without improvement in headache. Denies nausea, vomiting, loss of consciousness. Headache worsens with bending over/coughing. Pain is intermittent. Denies it being unbearable or the worst headache of his life.

## 2021-08-01 NOTE — Discharge Instructions (Signed)
I am concerned that you might be developing migraines.  I think it is reasonable to follow-up with a neurologist as we discussed so please call specialist and schedule an appointment.  I would also recommend following up with your primary care provider within 1 week.  Take Naprosyn up to twice a day as needed.  Do not take additional NSAIDs including aspirin, ibuprofen/Advil, naproxen/Aleve with this medication as it can cause stomach bleeding.  You can take Tylenol for breakthrough pain.  Take baclofen up to twice a day as needed.  This can make you sleepy so do not drive or drink alcohol while taking it.  If you have any worsening of symptoms including recurrent vision changes, severe headache, nausea/vomiting interfering with oral intake you need to go to the hospital for CT scan as we discussed.

## 2021-08-01 NOTE — ED Provider Notes (Addendum)
[facility]    CSN: [id number] Arrival date & time: 08/01/21  [id number]      History   Chief Complaint No chief complaint on file.   HPI Derrick Hart is a 30 y.o. male.   Patient presents today with a several day history of headache.  He reports that initially he developed a bright spot in his vision but this resolved and he developed a severe headache over his right eye.  Currently pain is not severe and is only rated 1/2 on a 0-10 pain scale but with bending over or certain activities it increases significantly.  He describes this as a throbbing that is worse with activity.  He denies history of primary headache disorder.  Denies family history of migraines or other conditions as well.  Denies any medication changes or recent illness but he did have COVID approximately 1 month ago.  He denies any difficulty with vision at this time, weakness, difficulty speaking, nausea, vomiting.  He denies any recent head injury.  Reports symptoms are improving and he does not describe this as the worst headache of his life.   Past Medical History:  Diagnosis Date   Anxiety    Chronic pain of left wrist 10/07/2015   Cough 03/11/2012   GERD (gastroesophageal reflux disease)    Obesity (BMI 30-39.9) 04/22/2016   OSA (obstructive sleep apnea) 02/19/2016   Mild with AHI 9.4/hr   Panic attack    PVC (premature ventricular contraction) 04/22/2016   Snoring 04/22/2016   Stuffy and runny nose 03/11/2012   clear drainage from nose   Tenosynovitis of hand 02/2012   left    Patient Active Problem List   Diagnosis Date Noted   PVC (premature ventricular contraction) 04/22/2016   Snoring 04/22/2016   Chest pain 04/22/2016   Obesity (BMI 30-39.9) 04/22/2016   Allergic rhinitis 03/09/2016   Heartburn 03/09/2016   OSA (obstructive sleep apnea) 02/19/2016   Chronic pain of left wrist 10/07/2015   Panic attack 08/11/2013    Past Surgical History:  Procedure Laterality Date   WISDOM TOOTH  EXTRACTION         Home Medications    Prior to Admission medications   Medication Sig Start Date End Date Taking? Authorizing Provider  baclofen (LIORESAL) 10 MG tablet Take 1 tablet (10 mg total) by mouth 2 (two) times daily as needed for muscle spasms. 08/01/21  Yes Lupita Rosales K, PA-C  naproxen (NAPROSYN) 500 MG tablet Take 1 tablet (500 mg total) by mouth 2 (two) times daily. 08/01/21  Yes Katerra Ingman, Noberto RetortErin K, PA-C  acetaminophen (TYLENOL) 500 MG tablet Take 1,000 mg by mouth every 6 (six) hours as needed for mild pain.    [provider]  ALPRAZolam Prudy Feeler(XANAX) 0.5 MG tablet Take 0.5 mg by mouth daily as needed (anxiety attacks).     [provider]  bismuth subsalicylate (PEPTO BISMOL) 262 MG/15ML suspension Take 30 mLs by mouth every 6 (six) hours as needed for indigestion. Reported on 03/09/2016    [provider]  mupirocin ointment (BACTROBAN) 2 % Apply 1 application topically 2 (two) times daily. 05/10/21   Teryl Gubler, Noberto RetortErin K, PA-C  ranitidine (ZANTAC) 150 MG tablet Take 1 tablet (150 mg total) by mouth 2 (two) times daily. 05/21/16   Bobbitt, Heywood Ilesalph Carter, MD  sertraline (ZOLOFT) 50 MG tablet Take 50 mg by mouth daily.    [provider]    Family History Family History  Problem Relation Age of Onset  Hypertension Mother    Anxiety disorder Mother    Allergic rhinitis Mother    Hypertension Father    Heart attack Maternal Grandmother    Heart attack Maternal Grandfather    Stroke Maternal Grandfather    Cancer Paternal Grandmother    Cancer Paternal Grandfather    Asthma Brother    Allergic rhinitis Brother    Angioedema Neg Hx    Atopy Neg Hx    Eczema Neg Hx    Immunodeficiency Neg Hx    Urticaria Neg Hx     Social History Social History   Tobacco Use   Smoking status: Never   Smokeless tobacco: Former    Quit date: 02/19/2015  Substance Use Topics   Alcohol use: No    Alcohol/week: 0.0 standard drinks    Comment: occasionally   Drug  use: No     Allergies   Tramadol   Review of Systems Review of Systems  Constitutional:  Positive for activity change. Negative for appetite change, fatigue and fever.  Respiratory:  Negative for cough and shortness of breath.   Cardiovascular:  Negative for chest pain.  Gastrointestinal:  Negative for abdominal pain, diarrhea, nausea and vomiting.  Musculoskeletal:  Negative for arthralgias, myalgias and neck pain.  Neurological:  Positive for dizziness and headaches. Negative for syncope, facial asymmetry, speech difficulty, weakness, light-headedness and numbness.    Physical Exam Triage Vital Signs ED Triage Vitals  Enc Vitals Group     BP 08/01/21 0851 135/87     Pulse Rate 08/01/21 0851 77     Resp 08/01/21 0851 16     Temp 08/01/21 0851 98.5 F (36.9 C)     Temp Source 08/01/21 0851 Oral     SpO2 08/01/21 0851 95 %     Weight --      Height --      Head Circumference --      Peak Flow --      Pain Score 08/01/21 0855 1     Pain Loc --      Pain Edu? --      Excl. in GC? --    No data found.  Updated Vital Signs BP 135/87 (BP Location: Right Arm)   Pulse 77   Temp 98.5 F (36.9 C) (Oral)   Resp 16   SpO2 95%   Visual Acuity Right Eye Distance: 20/25 -1 with correction Left Eye Distance: 20/30 with correction Bilateral Distance: 20/30 with correction  Right Eye Near:   Left Eye Near:    Bilateral Near:     Physical Exam Vitals reviewed.  Constitutional:      General: He is awake.     Appearance: Normal appearance. He is normal weight. He is not ill-appearing.     Comments: Very pleasant male appears stated age no acute distress sitting comfortably in exam room  HENT:     Head: Normocephalic and atraumatic.     Right Ear: Tympanic membrane, ear canal and external ear normal. Tympanic membrane is not erythematous or bulging.     Left Ear: Tympanic membrane, ear canal and external ear normal. Tympanic membrane is not erythematous or bulging.      Nose: Nose normal.     Mouth/Throat:     Tongue: Tongue does not deviate from midline.     Pharynx: Uvula midline. No oropharyngeal exudate or posterior oropharyngeal erythema.  Eyes:     Extraocular Movements: Extraocular movements intact.     Conjunctiva/sclera: Conjunctivae normal.  Pupils: Pupils are equal, round, and reactive to light.  Cardiovascular:     Rate and Rhythm: Normal rate and regular rhythm.     Heart sounds: Normal heart sounds, S1 normal and S2 normal. No murmur heard. Pulmonary:     Effort: Pulmonary effort is normal. No accessory muscle usage or respiratory distress.     Breath sounds: Normal breath sounds. No stridor. No wheezing, rhonchi or rales.     Comments: Clear to auscultation bilaterally Abdominal:     General: Bowel sounds are normal.     Palpations: Abdomen is soft.     Tenderness: There is no abdominal tenderness.  Musculoskeletal:     Comments: Strength 5/5 bilateral upper and lower extremities  Lymphadenopathy:     Head:     Right side of head: No submental, submandibular or tonsillar adenopathy.     Left side of head: No submental, submandibular or tonsillar adenopathy.     Cervical: No cervical adenopathy.  Neurological:     General: No focal deficit present.     Mental Status: He is alert and oriented to person, place, and time.     Cranial Nerves: Cranial nerves are intact.     Motor: Motor function is intact.     Coordination: Coordination is intact.     Gait: Gait is intact.     Comments: Cranial nerves II through XII intact.  No focal neurological defect on exam.  Psychiatric:        Behavior: Behavior is cooperative.     UC Treatments / Results  Labs (all labs ordered are listed, but only abnormal results are displayed) Labs Reviewed - No data to display  EKG   Radiology No results found.  Procedures Procedures (including critical care time)  Medications Ordered in UC Medications - No data to display  Initial  Impression / Assessment and Plan / UC Course  I have reviewed the triage vital signs and the nursing notes.  Pertinent labs & imaging results that were available during my care of the patient were reviewed by me and considered in my medical decision making (see chart for details).      Vital signs and physical exam are reassuring today with no indication for emergent evaluation or imaging.  Patient denies any alarm symptoms.  He was offered Toradol injection but does not like needles so instead was given Naprosyn 500 mg to be taken twice daily as needed with instruction not to take additional NSAIDs as well as baclofen up to twice daily as needed with instruction not to drive or drink alcohol with this medication as drowsiness is a common side effect.  He can use Tylenol for breakthrough pain.  We will try to establish patient with neurology given onset of headaches but discussed that it is likely he is developing migraines given age and clinical presentation.  Recommended patient drink plenty of fluid and eat small frequent meals to help prevent headaches.  Discussed at length alarm symptoms that warrant emergent evaluation.  Strict return precautions given to which patient expressed understanding.  Final Clinical Impressions(s) / UC Diagnoses   Final diagnoses:  Nonintractable headache, unspecified chronicity pattern, unspecified headache type  Vision changes     Discharge Instructions      I am concerned that you might be developing migraines.  I think it is reasonable to follow-up with a neurologist as we discussed so please call specialist and schedule an appointment.  I would also recommend following up with your  primary care provider within 1 week.  Take Naprosyn up to twice a day as needed.  Do not take additional NSAIDs including aspirin, ibuprofen/Advil, naproxen/Aleve with this medication as it can cause stomach bleeding.  You can take Tylenol for breakthrough pain.  Take baclofen up  to twice a day as needed.  This can make you sleepy so do not drive or drink alcohol while taking it.  If you have any worsening of symptoms including recurrent vision changes, severe headache, nausea/vomiting interfering with oral intake you need to go to the hospital for CT scan as we discussed.     ED Prescriptions     Medication Sig Dispense Auth. Provider   naproxen (NAPROSYN) 500 MG tablet Take 1 tablet (500 mg total) by mouth 2 (two) times daily. 30 tablet Yara Tomkinson K, PA-C   baclofen (LIORESAL) 10 MG tablet Take 1 tablet (10 mg total) by mouth 2 (two) times daily as needed for muscle spasms. 20 each Macklen Wilhoite, Noberto Retort, PA-C      PDMP not reviewed this encounter.   Jeani Hawking, PA-C 08/01/21 1001    Clemma Johnsen, Noberto Retort, PA-C 08/01/21 1038

## 2021-08-07 DIAGNOSIS — F439 Reaction to severe stress, unspecified: Secondary | ICD-10-CM | POA: Diagnosis not present

## 2021-08-07 DIAGNOSIS — R519 Headache, unspecified: Secondary | ICD-10-CM | POA: Diagnosis not present

## 2021-08-07 DIAGNOSIS — R030 Elevated blood-pressure reading, without diagnosis of hypertension: Secondary | ICD-10-CM | POA: Diagnosis not present

## 2021-08-07 DIAGNOSIS — H539 Unspecified visual disturbance: Secondary | ICD-10-CM | POA: Diagnosis not present

## 2021-08-08 ENCOUNTER — Encounter: Payer: Self-pay | Admitting: Neurology

## 2021-08-21 DIAGNOSIS — H43812 Vitreous degeneration, left eye: Secondary | ICD-10-CM | POA: Diagnosis not present

## 2021-08-30 ENCOUNTER — Other Ambulatory Visit: Payer: Self-pay

## 2021-08-30 ENCOUNTER — Emergency Department (HOSPITAL_BASED_OUTPATIENT_CLINIC_OR_DEPARTMENT_OTHER)
Admission: EM | Admit: 2021-08-30 | Discharge: 2021-08-30 | Disposition: A | Discharge status: Home / Self Care | Payer: BC Managed Care – PPO | Attending: Emergency Medicine | Admitting: Emergency Medicine

## 2021-08-30 ENCOUNTER — Encounter (HOSPITAL_BASED_OUTPATIENT_CLINIC_OR_DEPARTMENT_OTHER): Payer: Self-pay

## 2021-08-30 DIAGNOSIS — R002 Palpitations: Secondary | ICD-10-CM | POA: Diagnosis not present

## 2021-08-30 DIAGNOSIS — F17220 Nicotine dependence, chewing tobacco, uncomplicated: Secondary | ICD-10-CM | POA: Insufficient documentation

## 2021-08-30 LAB — CBC WITH DIFFERENTIAL/PLATELET
Abs Immature Granulocytes: 0.07 10*3/uL (ref 0.00–0.07)
Basophils Absolute: 0 10*3/uL (ref 0.0–0.1)
Basophils Relative: 0 %
Eosinophils Absolute: 0.2 10*3/uL (ref 0.0–0.5)
Eosinophils Relative: 3 %
HCT: 42.3 % (ref 39.0–52.0)
Hemoglobin: 14.3 g/dL (ref 13.0–17.0)
Immature Granulocytes: 1 %
Lymphocytes Relative: 30 %
Lymphs Abs: 2.1 10*3/uL (ref 0.7–4.0)
MCH: 28.4 pg (ref 26.0–34.0)
MCHC: 33.8 g/dL (ref 30.0–36.0)
MCV: 84.1 fL (ref 80.0–100.0)
Monocytes Absolute: 0.8 10*3/uL (ref 0.1–1.0)
Monocytes Relative: 11 %
Neutro Abs: 3.7 10*3/uL (ref 1.7–7.7)
Neutrophils Relative %: 55 %
Platelets: 201 10*3/uL (ref 150–400)
RBC: 5.03 MIL/uL (ref 4.22–5.81)
RDW: 12.9 % (ref 11.5–15.5)
WBC: 6.8 10*3/uL (ref 4.0–10.5)
nRBC: 0 % (ref 0.0–0.2)

## 2021-08-30 LAB — BASIC METABOLIC PANEL
Anion gap: 8 (ref 5–15)
BUN: 14 mg/dL (ref 6–20)
CO2: 25 mmol/L (ref 22–32)
Calcium: 9.5 mg/dL (ref 8.9–10.3)
Chloride: 104 mmol/L (ref 98–111)
Creatinine, Ser: 0.8 mg/dL (ref 0.61–1.24)
GFR, Estimated: >60 mL/min (ref >60)
Glucose, Bld: 110 mg/dL — ABNORMAL HIGH (ref 70–99)
Potassium: 4.2 mmol/L (ref 3.5–5.1)
Sodium: 137 mmol/L (ref 135–145)

## 2021-08-30 LAB — TSH: TSH: 3.686 u[IU]/mL (ref 0.350–4.500)

## 2021-08-30 LAB — MAGNESIUM: Magnesium: 1.9 mg/dL (ref 1.7–2.4)

## 2021-08-30 NOTE — ED Triage Notes (Signed)
Patient reports palpitations that have been lasting since last night.  Denies SOB and keeps stating maybe this related to anxiety.

## 2021-08-30 NOTE — ED Notes (Signed)
This RN presented the AVS utilizing Teachback Method. Patient verbalizes understanding of Discharge Instructions. Opportunity for Questioning and Answers were provided. Patient Discharged from ED ambulatory to Home with Family  

## 2021-08-30 NOTE — ED Notes (Signed)
Patient ambulated throughout department well with no complaints at this time. EKG appears to be WNL. MD Wickline informed.

## 2021-08-30 NOTE — ED Provider Notes (Signed)
[facility] EMERGENCY DEPT Provider Note   CSN: [id number] Arrival date & time: 08/30/21  0245     History Chief Complaint  Patient presents with   Palpitations    Derrick Hart is a 30 y.o. male.  The history is provided by the patient.  Palpitations Palpitations quality:  Fast Onset quality:  Sudden Timing:  Intermittent Progression:  Unchanged Chronicity:  New Context: anxiety   Context: not illicit drugs   Relieved by:  None tried Worsened by:  Nothing Associated symptoms: no chest pain, no lower extremity edema, no shortness of breath, no syncope and no vomiting   Patient w/history of anxiety, obesity presents with palpitations.  Patient reports several hours ago he felt that his heart was racing and he had a sensation that it was in his throat.  No chest pain or shortness of breath.  No syncope.  Has never had this before.  No new medications.  No illicit drug abuse.  He reports he uses occasional nicotine.  Denies alcohol use.  No known history of thyroid disease. He reports history of anxiety and feels that that is worsening.  No recent change in sleep pattern    Past Medical History:  Diagnosis Date   Anxiety    Chronic pain of left wrist 10/07/2015   Cough 03/11/2012   GERD (gastroesophageal reflux disease)    Obesity (BMI 30-39.9) 04/22/2016   OSA (obstructive sleep apnea) 02/19/2016   Mild with AHI 9.4/hr   Panic attack    PVC (premature ventricular contraction) 04/22/2016   Snoring 04/22/2016   Stuffy and runny nose 03/11/2012   clear drainage from nose   Tenosynovitis of hand 02/2012   left    Patient Active Problem List   Diagnosis Date Noted   PVC (premature ventricular contraction) 04/22/2016   Snoring 04/22/2016   Chest pain 04/22/2016   Obesity (BMI 30-39.9) 04/22/2016   Allergic rhinitis 03/09/2016   Heartburn 03/09/2016   OSA (obstructive sleep apnea) 02/19/2016   Chronic pain of left wrist 10/07/2015   Panic attack 08/11/2013     Past Surgical History:  Procedure Laterality Date   WISDOM TOOTH EXTRACTION         Family History  Problem Relation Age of Onset   Hypertension Mother    Anxiety disorder Mother    Allergic rhinitis Mother    Hypertension Father    Heart attack Maternal Grandmother    Heart attack Maternal Grandfather    Stroke Maternal Grandfather    Cancer Paternal Grandmother    Cancer Paternal Grandfather    Asthma Brother    Allergic rhinitis Brother    Angioedema Neg Hx    Atopy Neg Hx    Eczema Neg Hx    Immunodeficiency Neg Hx    Urticaria Neg Hx     Social History   Tobacco Use   Smoking status: Never   Smokeless tobacco: Current    Types: Snuff    Last attempt to quit: 02/19/2015  Vaping Use   Vaping Use: Never used  Substance Use Topics   Alcohol use: No    Alcohol/week: 0.0 standard drinks    Comment: occasionally   Drug use: No    Home Medications Prior to Admission medications   Medication Sig Start Date End Date Taking? Authorizing Provider  acetaminophen (TYLENOL) 500 MG tablet Take 1,000 mg by mouth every 6 (six) hours as needed for mild pain.    [provider]  ALPRAZolam Prudy Feeler) 0.5 MG tablet  Take 0.5 mg by mouth daily as needed (anxiety attacks).     [provider]  baclofen (LIORESAL) 10 MG tablet Take 1 tablet (10 mg total) by mouth 2 (two) times daily as needed for muscle spasms. 08/01/21   Raspet, Noberto Retort, PA-C  bismuth subsalicylate (PEPTO BISMOL) 262 MG/15ML suspension Take 30 mLs by mouth every 6 (six) hours as needed for indigestion. Reported on 03/09/2016    [provider]  mupirocin ointment (BACTROBAN) 2 % Apply 1 application topically 2 (two) times daily. 05/10/21   Raspet, Noberto Retort, PA-C  naproxen (NAPROSYN) 500 MG tablet Take 1 tablet (500 mg total) by mouth 2 (two) times daily. 08/01/21   Raspet, Noberto Retort, PA-C  ranitidine (ZANTAC) 150 MG tablet Take 1 tablet (150 mg total) by mouth 2 (two) times daily. 05/21/16   Bobbitt,  Heywood Iles, MD  sertraline (ZOLOFT) 50 MG tablet Take 50 mg by mouth daily.    [provider]    Allergies    Tramadol  Review of Systems   Review of Systems  Constitutional:  Negative for fever and unexpected weight change.  Respiratory:  Negative for shortness of breath.   Cardiovascular:  Positive for palpitations. Negative for chest pain, leg swelling and syncope.  Gastrointestinal:  Negative for abdominal pain, diarrhea and vomiting.  Neurological:  Negative for syncope.  Psychiatric/Behavioral:  Negative for sleep disturbance. The patient is nervous/anxious.   All other systems reviewed and are negative.  Physical Exam Updated Vital Signs BP (!) 148/89 (BP Location: Right Arm)   Pulse (!) 103   Temp 99.3 F (37.4 C) (Oral)   Resp 20   Ht 1.93 m (6\' 4" )   Wt (!) 172.4 kg   SpO2 99%   BMI 46.26 kg/m   Physical Exam CONSTITUTIONAL: Well developed/well nourished HEAD: Normocephalic/atraumatic EYES: EOMI/PERRL ENMT: Mucous membranes moist NECK: supple no meningeal signs, no thyromegaly or neck tenderness SPINE/BACK:entire spine nontender CV: S1/S2 noted, no murmurs/rubs/gallops noted LUNGS: Lungs are clear to auscultation bilaterally, no apparent distress ABDOMEN: soft, nontender, no rebound or guarding, bowel sounds noted throughout abdomen GU:no cva tenderness NEURO: Pt is awake/alert/appropriate, moves all extremitiesx4.  No facial droop.   EXTREMITIES: pulses normal/equal, full ROM, no calf tenderness or edema SKIN: warm, color normal PSYCH: no abnormalities of mood noted, alert and oriented to situation  ED Results / Procedures / Treatments   Labs (all labs ordered are listed, but only abnormal results are displayed) Labs Reviewed  BASIC METABOLIC PANEL - Abnormal; Notable for the following components:      Result Value   Glucose, Bld 110 (*)    All other components within normal limits  CBC WITH DIFFERENTIAL/PLATELET  MAGNESIUM  TSH     EKG EKG Interpretation  Date/Time:  Saturday August 30 2021 02:53:53 EDT Ventricular Rate:  74 PR Interval:  188 QRS Duration: 96 QT Interval:  360 QTC Calculation: 400 R Axis:   80 Text Interpretation: Sinus rhythm Confirmed by [birth year] (Zadie Rhine) on 08/30/2021 2:56:37 AM  Radiology No results found.  Procedures Procedures   Medications Ordered in ED Medications - No data to display  ED Course  I have reviewed the triage vital signs and the nursing notes.  Pertinent labs  results that were available during my care of the patient were reviewed by me and considered in my medical decision making (see chart for details).    MDM Rules/Calculators/A&P  3:55 AM Patient presents with palpitations.  Reports he can feel his heart beating in his throat.  No syncope or chest pain.  He has had an occasional PVC on the monitor.  Labs overall reassuring thus far.  Will refer to cardiology as an outpatient for monitoring. 4:54 AM Patient reports feeling improved.  Patient ambulated without difficulty.  Heart rate has stabilized in the 60s and 70s.  Patient showed me his apple watch heart readings from home.  He may have had an episode of sinus pause but no other dysrhythmias prior to arrival  This point patient appears appropriate discharge home.  He will need to follow-up on TSH as this was a send out lab. Patient denies any chest pain, no hypoxia to suggest PE  Final Clinical Impression(s) / ED Diagnoses Final diagnoses:  Palpitations    Rx / DC Orders ED Discharge Orders          Ordered    Ambulatory referral to Cardiology        08/30/21 0355             Zadie Rhine, MD 08/30/21 [phone]

## 2021-10-09 DIAGNOSIS — L814 Other melanin hyperpigmentation: Secondary | ICD-10-CM | POA: Diagnosis not present

## 2021-10-09 DIAGNOSIS — H1870 Unspecified corneal deformity: Secondary | ICD-10-CM | POA: Diagnosis not present

## 2021-10-09 DIAGNOSIS — D225 Melanocytic nevi of trunk: Secondary | ICD-10-CM | POA: Diagnosis not present

## 2021-10-09 DIAGNOSIS — H43812 Vitreous degeneration, left eye: Secondary | ICD-10-CM | POA: Diagnosis not present

## 2021-10-09 DIAGNOSIS — L821 Other seborrheic keratosis: Secondary | ICD-10-CM | POA: Diagnosis not present

## 2021-10-29 ENCOUNTER — Ambulatory Visit (HOSPITAL_BASED_OUTPATIENT_CLINIC_OR_DEPARTMENT_OTHER): Payer: BC Managed Care – PPO | Admitting: Cardiology

## 2021-10-29 ENCOUNTER — Encounter (HOSPITAL_BASED_OUTPATIENT_CLINIC_OR_DEPARTMENT_OTHER): Payer: Self-pay | Admitting: Cardiology

## 2021-10-29 ENCOUNTER — Ambulatory Visit: Payer: BC Managed Care – PPO | Admitting: Neurology

## 2021-10-29 ENCOUNTER — Ambulatory Visit (INDEPENDENT_AMBULATORY_CARE_PROVIDER_SITE_OTHER): Payer: BC Managed Care – PPO

## 2021-10-29 ENCOUNTER — Other Ambulatory Visit: Payer: Self-pay

## 2021-10-29 VITALS — BP 122/76 | HR 91 | Ht 76.0 in | Wt 389.1 lb

## 2021-10-29 DIAGNOSIS — I491 Atrial premature depolarization: Secondary | ICD-10-CM | POA: Diagnosis not present

## 2021-10-29 DIAGNOSIS — R002 Palpitations: Secondary | ICD-10-CM

## 2021-10-29 DIAGNOSIS — Z6841 Body Mass Index (BMI) 40.0 and over, adult: Secondary | ICD-10-CM

## 2021-10-29 DIAGNOSIS — Z7189 Other specified counseling: Secondary | ICD-10-CM

## 2021-10-29 NOTE — Patient Instructions (Signed)
Medication Instructions:  Your Physician recommend you continue on your current medication as directed.    *If you need a refill on your cardiac medications before your next appointment, please call your pharmacy*   Lab Work: None ordered today   Testing/Procedures: Our physician has recommended that you wear an 14  DAY ZIO-PATCH monitor. The Zio patch cardiac monitor continuously records heart rhythm data for up to 14 days, this is for patients being evaluated for multiple types heart rhythms. For the first 24 hours post application, please avoid getting the Zio monitor wet in the shower or by excessive sweating during exercise. After that, feel free to carry on with regular activities. Keep soaps and lotions away from the ZIO XT Patch.      Follow-Up: At [facility], you and your health needs are our priority.  As part of our continuing mission to provide you with exceptional heart care, we have created designated Provider Care Teams.  These Care Teams include your primary Cardiologist (physician) and Advanced Practice Providers (APPs -  Physician Assistants and Nurse Practitioners) who all work together to provide you with the care you need, when you need it.  We recommend signing up for the patient portal called "MyChart".  Sign up information is provided on this After Visit Summary.  MyChart is used to connect with patients for Virtual Visits (Telemedicine).  Patients are able to view lab/test results, encounter notes, upcoming appointments, etc.  Non-urgent messages can be sent to your provider as well.   To learn more about what you can do with MyChart, go to [email or web address].    Your next appointment:   As needed  The format for your next appointment:   In Person  Provider:   Jodelle Red, MD   Christena Deem- Long Term Monitor Instructions  Your physician has requested you wear a ZIO patch monitor for 14 days.  This is a single patch monitor. [employer] supplies one  patch monitor per enrollment. Additional stickers are not available. Please do not apply patch if you will be having a Nuclear Stress Test,  Echocardiogram, Cardiac CT, MRI, or Chest Xray during the period you would be wearing the  monitor. The patch cannot be worn during these tests. You cannot remove and re-apply the  ZIO XT patch monitor.  Your ZIO patch monitor will be mailed 3 day USPS to your address on file. It may take 3-5 days  to receive your monitor after you have been enrolled.  Once you have received your monitor, please review the enclosed instructions. Your monitor  has already been registered assigning a specific monitor serial # to you.  Billing and Patient Assistance Program Information  We have supplied [employer] with any of your insurance information on file for billing purposes. [employer] offers a sliding scale Patient Assistance Program for patients that do not have  insurance, or whose insurance does not completely cover the cost of the ZIO monitor.  You must apply for the Patient Assistance Program to qualify for this discounted rate.  To apply, please call [employer] at [phone], select option 4, select option 2, ask to apply for  Patient Assistance Program. Meredeth Ide will ask your household income, and how many people  are in your household. They will quote your out-of-pocket cost based on that information.  [employer] will also be able to set up a 71-month, interest-free payment plan if needed.  Applying the monitor   Shave hair from upper left chest.  Hold abrader disc by  orange tab. Rub abrader in 40 strokes over the upper left chest as  indicated in your monitor instructions.  Clean area with 4 enclosed alcohol pads. Let dry.  Apply patch as indicated in monitor instructions. Patch will be placed under collarbone on left  side of chest with arrow pointing upward.  Rub patch adhesive wings for 2 minutes. Remove white label marked "1". Remove the white  label marked  "2". Rub patch adhesive wings for 2 additional minutes.  While looking in a mirror, press and release button in center of patch. A small green light will  flash 3-4 times. This will be your only indicator that the monitor has been turned on.  Do not shower for the first 24 hours. You may shower after the first 24 hours.  Press the button if you feel a symptom. You will hear a small click. Record Date, Time and  Symptom in the Patient Logbook.  When you are ready to remove the patch, follow instructions on the last 2 pages of Patient  Logbook. Stick patch monitor onto the last page of Patient Logbook.  Place Patient Logbook in the blue and white box. Use locking tab on box and tape box closed  securely. The blue and white box has prepaid postage on it. Please place it in the mailbox as  soon as possible. Your physician should have your test results approximately 7 days after the  monitor has been mailed back to [facility].  Call [place] [facility] at [phone] if you have questions regarding  your ZIO XT patch monitor. Call them immediately if you see an orange light blinking on your  monitor.  If your monitor falls off in less than 4 days, contact our Monitor department at [phone].  If your monitor becomes loose or falls off after 4 days call [employer] at [phone] for  suggestions on securing your monitor

## 2021-10-29 NOTE — Progress Notes (Signed)
Cardiology Office Note:    Date:  10/29/2021   ID:  Derrick Hart, DOB 1991/12/01, MRN [id number]  PCP:  Antony Contras, MD  Cardiologist:  Buford Dresser, MD  Referring MD: Ripley Fraise, MD   CC: new patient consultation for palpitations   History of Present Illness:    Derrick Hart is a 30 y.o. male with a hx of PVCs, OSA, obesity, anxiety, and GERD, who is seen as a new consult at the request of Ripley Fraise, MD for the evaluation and management of palpitations.  Tachycardia/palpitations: -Initial onset: Since starting proton-pump inhibitors for treating acid reflux.  -Frequency/Duration: Overall, he reports his symptoms are mostly unchanged since he was seen in the ED 08/2021. He continues to feel random "thud" palpitations. Sometimes he feels as if his heart is constantly skipping a beat. He has tried using the EKG function on his apple watch, which has detected PACs. Duration has been as long as a couple hours -Associated symptoms: Occasionally he notices associated symptoms including lightheadedness and abnormal stomach sensations. He has felt lightheaded a few times, but notes this may be due to anxiety. -Aggravating/alleviating factors: Worse when lying down, Worse a couple hours after eating, -Syncope/near syncope: none -Prior workup: monitor, echo, ETT unremarkable in 2016 -Caffeine: Quit drinking caffeine a few years ago -Exercise level: Trying to be more active, but afterwards it takes a very long time for his heart rate to decrease. -Labs: TSH, kidney function/electrolytes, CBC reviewed. -Cardiac ROS: no chest pain, no shortness of breath, no PND, no orthopnea, no LE edema. -Family history: Father has a heart condition, arrhythmia. Grandfather died of heart disease.  He believes he may bruise easily, but has not noticed bruises recently.  He denies any chest pain, or shortness of breath. No headaches, syncope, orthopnea, PND, or lower extremity  edema.  Past Medical History:  Diagnosis Date   Anxiety    Chronic pain of left wrist 10/07/2015   Cough 03/11/2012   GERD (gastroesophageal reflux disease)    Obesity (BMI 30-39.9) 04/22/2016   OSA (obstructive sleep apnea) 02/19/2016   Mild with AHI 9.4/hr   Panic attack    PVC (premature ventricular contraction) 04/22/2016   Snoring 04/22/2016   Stuffy and runny nose 03/11/2012   clear drainage from nose   Tenosynovitis of hand 02/2012   left    Past Surgical History:  Procedure Laterality Date   WISDOM TOOTH EXTRACTION      Current Medications: Current Outpatient Medications on File Prior to Visit  Medication Sig   acetaminophen (TYLENOL) 500 MG tablet Take 1,000 mg by mouth every 6 (six) hours as needed for mild pain.   ALPRAZolam (XANAX) 0.5 MG tablet Take 0.5 mg by mouth daily as needed (anxiety attacks).    baclofen (LIORESAL) 10 MG tablet Take 1 tablet (10 mg total) by mouth 2 (two) times daily as needed for muscle spasms.   bismuth subsalicylate (PEPTO BISMOL) 262 MG/15ML suspension Take 30 mLs by mouth every 6 (six) hours as needed for indigestion. Reported on 03/09/2016   mupirocin ointment (BACTROBAN) 2 % Apply 1 application topically 2 (two) times daily.   naproxen (NAPROSYN) 500 MG tablet Take 1 tablet (500 mg total) by mouth 2 (two) times daily.   ranitidine (ZANTAC) 150 MG tablet Take 1 tablet (150 mg total) by mouth 2 (two) times daily.   sertraline (ZOLOFT) 50 MG tablet Take 50 mg by mouth daily.   No current facility-administered medications on file prior to visit.  Allergies:   Tramadol, Dexlansoprazole, and Esomeprazole magnesium   Social History   Tobacco Use   Smoking status: Never   Smokeless tobacco: Current    Types: Snuff    Last attempt to quit: 02/19/2015  Vaping Use   Vaping Use: Never used  Substance Use Topics   Alcohol use: No    Alcohol/week: 0.0 standard drinks    Comment: occasionally   Drug use: No    Family History: family history  includes Allergic rhinitis in his brother and mother; Anxiety disorder in his mother; Asthma in his brother; Cancer in his paternal grandfather and paternal grandmother; Heart attack in his maternal grandfather and maternal grandmother; Hypertension in his father and mother; Stroke in his maternal grandfather. There is no history of Angioedema, Atopy, Eczema, Immunodeficiency, or Urticaria.  ROS:   Please see the history of present illness.  Additional pertinent ROS: Constitutional: Negative for chills, fever, night sweats, unintentional weight loss  HENT: Negative for ear pain and hearing loss.   Eyes: Negative for loss of vision and eye pain.  Respiratory: Negative for cough, sputum, wheezing.   Cardiovascular: See HPI. Gastrointestinal: Negative for abdominal pain, melena, and hematochezia.  Genitourinary: Negative for dysuria and hematuria.  Musculoskeletal: Negative for falls and myalgias.  Skin: Negative for itching and rash.  Neurological: Negative for focal weakness, focal sensory changes and loss of consciousness.  Endo/Heme/Allergies: Positive for bruise easily. Does not bleed easily.     EKGs/Labs/Other Studies Reviewed:    The following studies were reviewed today:  Echo 10/29/2015: - Left ventricle: The cavity size was normal. Wall thickness was    increased in a pattern of mild LVH. Systolic function was normal.    The estimated ejection fraction was in the range of 55% to 60%.    Wall motion was normal; there were no regional wall motion    abnormalities. Left ventricular diastolic function parameters    were normal.  - Left atrium: The atrium was mildly dilated.  - Right ventricle: The cavity size was normal. Wall thickness was    normal. Systolic function was normal.  - Inferior vena cava: The vessel was normal in size. The    respirophasic diameter changes were in the normal range (>= 50%),    consistent with normal central venous pressure.   Impressions:  -  Normal study.   ETT 10/29/2015: Blood pressure demonstrated a hypertensive response to exercise. There was no ST segment deviation noted during stress.   Normal ETT at good work load   EKG:  EKG is personally reviewed.   10/29/2021: NSR at 91 bpm  Recent Labs: 08/30/2021: BUN 14; Creatinine, Ser 0.80; Hemoglobin 14.3; Magnesium 1.9; Platelets 201; Potassium 4.2; Sodium 137; TSH 3.686   Recent Lipid Panel No results found for: CHOL, TRIG, HDL, CHOLHDL, VLDL, LDLCALC, LDLDIRECT  Physical Exam:    VS:  BP 122/76 (BP Location: Left Arm, Patient Position: Sitting, Cuff Size: Large)   Pulse 91   Ht 6\' 4"  (1.93 m)   Wt (!) 389 lb 1.6 oz (176.5 kg)   SpO2 98%   BMI 47.36 kg/m     Wt Readings from Last 3 Encounters:  10/29/21 (!) 389 lb 1.6 oz (176.5 kg)  08/30/21 (!) 380 lb (172.4 kg)  04/22/16 (!) 371 lb 6.4 oz (168.5 kg)    GEN: Well nourished, well developed in no acute distress HEENT: Normal, moist mucous membranes NECK: No JVD CARDIAC: regular rhythm, normal S1 and S2, no rubs or  gallops. No murmur. VASCULAR: Radial and DP pulses 2+ bilaterally. No carotid bruits RESPIRATORY:  Clear to auscultation without rales, wheezing or rhonchi  ABDOMEN: Soft, non-tender, non-distended MUSCULOSKELETAL:  Ambulates independently SKIN: Warm and dry, no edema NEUROLOGIC:  Alert and oriented x 3. No focal neuro deficits noted. PSYCHIATRIC:  Normal affect    ASSESSMENT:    1. Palpitation   2. PAC (premature atrial contraction)   3. Class 3 severe obesity due to excess calories without serious comorbidity with body mass index (BMI) of 45.0 to 49.9 in adult [facility])   4. Cardiac risk counseling    PLAN:    Palpitations PACs on Apple ECG -reviewed Apple ECG, nice clear pattern, appears to be PAC -discussed options, including continued watching vs repeat monitor. After shared decision making, he would like to proceed with monitor. Will place Zio 2 week monitor, instructed on use -will  evaluate for heart rate variability as well, as he reports slow resolution of sinus tachycardia after exercise on occasion  Cardiac risk counseling and prevention recommendations Obesity, BMI 47 -discussed normal heart rate pattern with exercise, how vagal tone relates to this -recommend moderate walking, 3-5 times/week for 30-50 minutes each session. Aim for at least 150 minutes.week. Goal should be pace of 3 miles/hours, or walking 1.5 miles in 30 minutes -recommend avoidance of tobacco products. Avoid excess alcohol. -ASCVD risk score: The ASCVD Risk score (Arnett DK, et al., 2019) failed to calculate for the following reasons:   The 2019 ASCVD risk score is only valid for ages 60 to 28    Plan for follow up: If monitor without high risk findings, I would be happy to see him back as needed  Buford Dresser, MD, PhD, [facility]    Medication Adjustments/Labs and Tests Ordered: Current medicines are reviewed at length with the patient today.  Concerns regarding medicines are outlined above.   Orders Placed This Encounter  Procedures   LONG TERM MONITOR (3-14 DAYS)   EKG 12-Lead    No orders of the defined types were placed in this encounter.  Patient Instructions  Medication Instructions:  Your Physician recommend you continue on your current medication as directed.    *If you need a refill on your cardiac medications before your next appointment, please call your pharmacy*   Lab Work: None ordered today   Testing/Procedures: Our physician has recommended that you wear an [place] monitor. The Zio patch cardiac monitor continuously records heart rhythm data for up to 14 days, this is for patients being evaluated for multiple types heart rhythms. For the first 24 hours post application, please avoid getting the Zio monitor wet in the shower or by excessive sweating during exercise. After that, feel free to carry on with regular activities.  Keep soaps and lotions away from the ZIO XT Patch.      Follow-Up: At [facility], you and your health needs are our priority.  As part of our continuing mission to provide you with exceptional heart care, we have created designated Provider Care Teams.  These Care Teams include your primary Cardiologist (physician) and Advanced Practice Providers (APPs -  Physician Assistants and Nurse Practitioners) who all work together to provide you with the care you need, when you need it.  We recommend signing up for the patient portal called "MyChart".  Sign up information is provided on this After Visit Summary.  MyChart is used to connect with patients for Virtual Visits (Telemedicine).  Patients are able to view lab/test results, encounter notes, upcoming appointments, etc.  Non-urgent messages can be sent to your provider as well.   To learn more about what you can do with MyChart, go to [email or web address].    Your next appointment:   As needed  The format for your next appointment:   In Person  Provider:   Buford Dresser, MD   [place] Instructions  Your physician has requested you wear a ZIO patch monitor for 14 days.  This is a single patch monitor. [employer] supplies one patch monitor per enrollment. Additional stickers are not available. Please do not apply patch if you will be having a Nuclear Stress Test,  Echocardiogram, Cardiac CT, MRI, or Chest Xray during the period you would be wearing the  monitor. The patch cannot be worn during these tests. You cannot remove and re-apply the  ZIO XT patch monitor.  Your ZIO patch monitor will be mailed 3 day USPS to your address on file. It may take 3-5 days  to receive your monitor after you have been enrolled.  Once you have received your monitor, please review the enclosed instructions. Your monitor  has already been registered assigning a specific monitor serial # to you.  Billing and Patient Assistance  Program Information  We have supplied [employer] with any of your insurance information on file for billing purposes. [employer] offers a sliding scale Patient Assistance Program for patients that do not have  insurance, or whose insurance does not completely cover the cost of the ZIO monitor.  You must apply for the Patient Assistance Program to qualify for this discounted rate.  To apply, please call [employer] at [phone], select option 4, select option 2, ask to apply for  Patient Assistance Program. Derrick Hart will ask your household income, and how many people  are in your household. They will quote your out-of-pocket cost based on that information.  [employer] will also be able to set up a 16-month, interest-free payment plan if needed.  Applying the monitor   Shave hair from upper left chest.  Hold abrader disc by orange tab. Rub abrader in 40 strokes over the upper left chest as  indicated in your monitor instructions.  Clean area with 4 enclosed alcohol pads. Let dry.  Apply patch as indicated in monitor instructions. Patch will be placed under collarbone on left  side of chest with arrow pointing upward.  Rub patch adhesive wings for 2 minutes. Remove white label marked "1". Remove the white  label marked "2". Rub patch adhesive wings for 2 additional minutes.  While looking in a mirror, press and release button in center of patch. A small green light will  flash 3-4 times. This will be your only indicator that the monitor has been turned on.  Do not shower for the first 24 hours. You may shower after the first 24 hours.  Press the button if you feel a symptom. You will hear a small click. Record Date, Time and  Symptom in the Patient Logbook.  When you are ready to remove the patch, follow instructions on the last 2 pages of Patient  Logbook. Stick patch monitor onto the last page of Patient Logbook.  Place Patient Logbook in the blue and white box. Use locking tab on box and tape box  closed  securely. The blue and white box has prepaid postage on it. Please place it in the mailbox as  soon as possible. Your physician should have  your test results approximately 7 days after the  monitor has been mailed back to [facility].  Call [facility] Customer Care at [phone] if you have questions regarding  your ZIO XT patch monitor. Call them immediately if you see an orange light blinking on your  monitor.  If your monitor falls off in less than 4 days, contact our Monitor department at [phone].  If your monitor becomes loose or falls off after 4 days call [employer] at [phone] for  suggestions on securing your monitor    I,Mathew Stumpf,acting as a scribe for Jodelle Red, MD.,have documented all relevant documentation on the behalf of Jodelle Red, MD,as directed by  Jodelle Red, MD while in the presence of Jodelle Red, MD.  I, Jodelle Red, MD, have reviewed all documentation for this visit. The documentation on 10/29/21 for the exam, diagnosis, procedures, and orders are all accurate and complete.   Signed, Jodelle Red, MD PhD 10/29/2021 3:21 PM    [facility]

## 2021-11-19 DIAGNOSIS — R002 Palpitations: Secondary | ICD-10-CM | POA: Diagnosis not present

## 2022-01-20 DIAGNOSIS — R519 Headache, unspecified: Secondary | ICD-10-CM | POA: Diagnosis not present

## 2022-01-20 DIAGNOSIS — F419 Anxiety disorder, unspecified: Secondary | ICD-10-CM | POA: Diagnosis not present

## 2022-01-20 DIAGNOSIS — R29898 Other symptoms and signs involving the musculoskeletal system: Secondary | ICD-10-CM | POA: Diagnosis not present

## 2022-01-21 ENCOUNTER — Encounter: Payer: Self-pay | Admitting: Neurology

## 2022-02-16 DIAGNOSIS — G4733 Obstructive sleep apnea (adult) (pediatric): Secondary | ICD-10-CM | POA: Diagnosis not present

## 2022-04-07 NOTE — Progress Notes (Signed)
? ?NEUROLOGY CONSULTATION NOTE ? ?Derrick Hart ?MRN: [id number] ?DOB: 01-25-1991 ? ?Referring provider: Almedia Balls, NP ?Primary care provider: Antony Contras, MD ? ?Reason for consult:  headache ? ?Assessment/Plan:  ? ?Migraine with aura, without status migrainosus, not intractable ?Numbness and tingling - evaluate for underlying peripheral neuropathy ? ?If he should have another migraine, will have him try sumatriptan 100mg  ?For workup of neuropathy, will check ANA, sed rate, B12, ACE, SPEP/IFE ?Further recommendations pending results.   ? ? ? ?Subjective:  ?Derrick Hart is a 31 year old male with anxiety who presents for headaches and paresthesias.  History supplemented by referring provider's note. ? ?Several months ago, he had a severe headache.  It was a 6/10 frontal pressure-like and throbbing headache associated with seeing spots and blurred lower half vision in his left eye (possibly both).  Photophobia but no nausea, vomiting, phonophobia, numbness or weakness.  Visual disturbance lasted a day.  Headache lasted about 2 days.  Did not respond to over the counter pain relievers.  He saw the eye doctor with no specific diagnosis.  He had a second similar episode a few days ago but less severe.  Denies history of headaches.  ? ?For several months he also reports intermittent tingling in his hands and feet, usually when sitting down but not when he is active on his feet.  No associated pain or weakness.  Denies neck and back pain.  ? ?01/20/2022 LABS: TSH 1.60; CPK 105; CMP with Na 140, K 4.4, Cl 103, CO2 30, glucose 74, BUN 11, Cr 0.80, t bili 0.8, ALP 53, AST 20, ALT 31; CBC with WBC 5.9, HGB 14.2, HCT 42.4, PLT 194,  ? ? ?PAST MEDICAL HISTORY: ?Past Medical History:  ?Diagnosis Date  ? Anxiety   ? Chronic pain of left wrist 10/07/2015  ? Cough 03/11/2012  ? GERD (gastroesophageal reflux disease)   ? Obesity (BMI 30-39.9) 04/22/2016  ? OSA (obstructive sleep apnea) 02/19/2016  ? Mild with AHI 9.4/hr  ? Panic  attack   ? PVC (premature ventricular contraction) 04/22/2016  ? Snoring 04/22/2016  ? Stuffy and runny nose 03/11/2012  ? clear drainage from nose  ? Tenosynovitis of hand 02/2012  ? left  ? ? ?PAST SURGICAL HISTORY: ?Past Surgical History:  ?Procedure Laterality Date  ? WISDOM TOOTH EXTRACTION    ? ? ?MEDICATIONS: ?Current Outpatient Medications on File Prior to Visit  ?Medication Sig Dispense Refill  ? fluticasone (FLONASE ALLERGY RELIEF) 50 MCG/ACT nasal spray 1 spray in each nostril    ? loratadine (CLARITIN) 10 MG tablet Take 10 mg by mouth daily.    ? ?No current facility-administered medications on file prior to visit.  ? ? ?ALLERGIES: ?Allergies  ?Allergen Reactions  ? Tramadol Shortness Of Breath  ? Dexlansoprazole   ?  Other reaction(s): heart skip  ? Esomeprazole Magnesium   ?  Other reaction(s): heart palpitations  ? ? ?FAMILY HISTORY: ?Family History  ?Problem Relation Age of Onset  ? Hypertension Mother   ? Anxiety disorder Mother   ? Allergic rhinitis Mother   ? Hypertension Father   ? Heart attack Maternal Grandmother   ? Heart attack Maternal Grandfather   ? Stroke Maternal Grandfather   ? Cancer Paternal Grandmother   ? Cancer Paternal Grandfather   ? Asthma Brother   ? Allergic rhinitis Brother   ? Angioedema Neg Hx   ? Atopy Neg Hx   ? Eczema Neg Hx   ? Immunodeficiency Neg Hx   ?  Urticaria Neg Hx   ? ? ?Objective:  ?Blood pressure (!) 146/80, pulse 76, height 6\' 4"  (1.93 m), weight (!) 368 lb (166.9 kg), SpO2 95 %. ?General: No acute distress.  Patient appears well-groomed.   ?Head:  Normocephalic/atraumatic ?Eyes:  fundi examined but not visualized ?Neck: supple, no paraspinal tenderness, full range of motion ?Back:  no tenderness to palpation ?Heart: regular rate and rhythm ?Neurological Exam: ?Mental status: alert and oriented to person, place, and time, recent and remote memory intact, fund of knowledge intact, attention and concentration intact, speech fluent and not dysarthric, language  intact. ?Cranial nerves: ?CN I: not tested ?CN II: pupils equal, round and reactive to light, visual fields intact ?CN III, IV, VI:  full range of motion, no nystagmus, no ptosis ?CN V: facial sensation intact. ?CN VII: upper and lower face symmetric ?CN VIII: hearing intact ?CN IX, X: gag intact, uvula midline ?CN XI: sternocleidomastoid and trapezius muscles intact ?CN XII: tongue midline ?Bulk & Tone: normal, no fasciculations. ?Motor:  muscle strength 5/5 throughout ?Sensation:  Pinprick, temperature and vibratory sensation intact. ?Deep Tendon Reflexes:  2+ throughout,  toes downgoing.   ?Finger to nose testing:  Without dysmetria.   ?Heel to shin:  Without dysmetria.   ?Gait:  Normal station and stride.  Romberg negative. ? ? ? ?Thank you for allowing me to take part in the care of this patient. ? ?Metta Clines, DO ? ?CC:  Almedia Balls, NP ? Antony Contras, MD ? ? ? ? ?

## 2022-04-08 ENCOUNTER — Encounter: Payer: Self-pay | Admitting: Neurology

## 2022-04-08 ENCOUNTER — Ambulatory Visit: Payer: BC Managed Care – PPO | Admitting: Neurology

## 2022-04-08 ENCOUNTER — Other Ambulatory Visit (INDEPENDENT_AMBULATORY_CARE_PROVIDER_SITE_OTHER): Payer: BC Managed Care – PPO

## 2022-04-08 VITALS — BP 146/80 | HR 76 | Ht 76.0 in | Wt 368.0 lb

## 2022-04-08 DIAGNOSIS — R202 Paresthesia of skin: Secondary | ICD-10-CM | POA: Diagnosis not present

## 2022-04-08 DIAGNOSIS — G43109 Migraine with aura, not intractable, without status migrainosus: Secondary | ICD-10-CM

## 2022-04-08 DIAGNOSIS — R200 Anesthesia of skin: Secondary | ICD-10-CM

## 2022-04-08 MED ORDER — SUMATRIPTAN SUCCINATE 100 MG PO TABS: ORAL_TABLET | ORAL | 0 refills | Status: DC

## 2022-04-08 NOTE — Patient Instructions (Signed)
I think you had migraines.  If you get another one, take sumatriptan 100mg  at earliest onset, then may repeat after 2 hours (maximum 2 tablets in 24 hours) ?For workup of numbness and tingling, check ANA, sed rate, B12, ACE, SPEP/IFE ?Further recommendations pending results. ? ?

## 2022-04-09 ENCOUNTER — Encounter: Payer: Self-pay | Admitting: Neurology

## 2022-04-09 LAB — SPECIMEN STATUS REPORT

## 2022-04-09 LAB — SEDIMENTATION RATE: Sed Rate: 8 mm/hr (ref 0–15)

## 2022-04-09 LAB — VITAMIN B12: Vitamin B-12: 511 pg/mL (ref 232–1245)

## 2022-04-10 LAB — IMMUNOFIXATION, SERUM
IgA/Immunoglobulin A, Serum: 258 mg/dL (ref 90–386)
IgG (Immunoglobin G), Serum: 1218 mg/dL (ref 603–1613)
IgM (Immunoglobulin M), Srm: 116 mg/dL (ref 20–172)

## 2022-04-10 LAB — PROTEIN ELECTROPHORESIS, SERUM
Albumin ELP: 4.8 g/dL (ref 3.8–4.8)
Alpha 1: 0.3 g/dL (ref 0.2–0.3)
Alpha 2: 0.5 g/dL (ref 0.5–0.9)
Beta 2: 0.4 g/dL (ref 0.2–0.5)
Beta Globulin: 0.4 g/dL (ref 0.4–0.6)
Gamma Globulin: 1.2 g/dL (ref 0.8–1.7)
Total Protein: 7.5 g/dL (ref 6.1–8.1)

## 2022-04-10 LAB — ANA W/REFLEX: Anti Nuclear Antibody (ANA): NEGATIVE

## 2022-04-10 LAB — ANGIOTENSIN CONVERTING ENZYME: Angiotensin-Converting Enzyme: 15 U/L (ref 9–67)

## 2022-04-14 NOTE — Progress Notes (Signed)
LMOVM for pt to call the office.  ?

## 2022-06-04 ENCOUNTER — Other Ambulatory Visit: Payer: Self-pay | Admitting: Gastroenterology

## 2022-06-04 ENCOUNTER — Ambulatory Visit
Admission: RE | Admit: 2022-06-04 | Discharge: 2022-06-04 | Disposition: A | Discharge status: Home / Self Care | Payer: BC Managed Care – PPO | Source: Ambulatory Visit | Attending: Gastroenterology | Admitting: Gastroenterology

## 2022-06-04 DIAGNOSIS — R197 Diarrhea, unspecified: Secondary | ICD-10-CM | POA: Diagnosis not present

## 2022-06-04 DIAGNOSIS — R195 Other fecal abnormalities: Secondary | ICD-10-CM

## 2022-06-04 DIAGNOSIS — R140 Abdominal distension (gaseous): Secondary | ICD-10-CM | POA: Diagnosis not present

## 2022-06-09 DIAGNOSIS — Z23 Encounter for immunization: Secondary | ICD-10-CM | POA: Diagnosis not present

## 2022-06-09 DIAGNOSIS — Z0000 Encounter for general adult medical examination without abnormal findings: Secondary | ICD-10-CM | POA: Diagnosis not present

## 2022-06-09 DIAGNOSIS — E782 Mixed hyperlipidemia: Secondary | ICD-10-CM | POA: Diagnosis not present

## 2022-06-12 DIAGNOSIS — R197 Diarrhea, unspecified: Secondary | ICD-10-CM | POA: Diagnosis not present

## 2022-06-12 DIAGNOSIS — R140 Abdominal distension (gaseous): Secondary | ICD-10-CM | POA: Diagnosis not present

## 2022-06-12 DIAGNOSIS — R634 Abnormal weight loss: Secondary | ICD-10-CM | POA: Diagnosis not present

## 2022-06-12 DIAGNOSIS — R12 Heartburn: Secondary | ICD-10-CM | POA: Diagnosis not present

## 2022-06-12 DIAGNOSIS — R142 Eructation: Secondary | ICD-10-CM | POA: Diagnosis not present

## 2022-06-29 IMAGING — DX DG ABDOMEN 1V
2 series · 2 of 2 positions shown · non-contrast
Comparison: None Available.

CLINICAL DATA: Bloating and loose stool.  Assess for stool burden.

EXAM:
ABDOMEN - 1 VIEW

[view not recorded (1 of 2)]
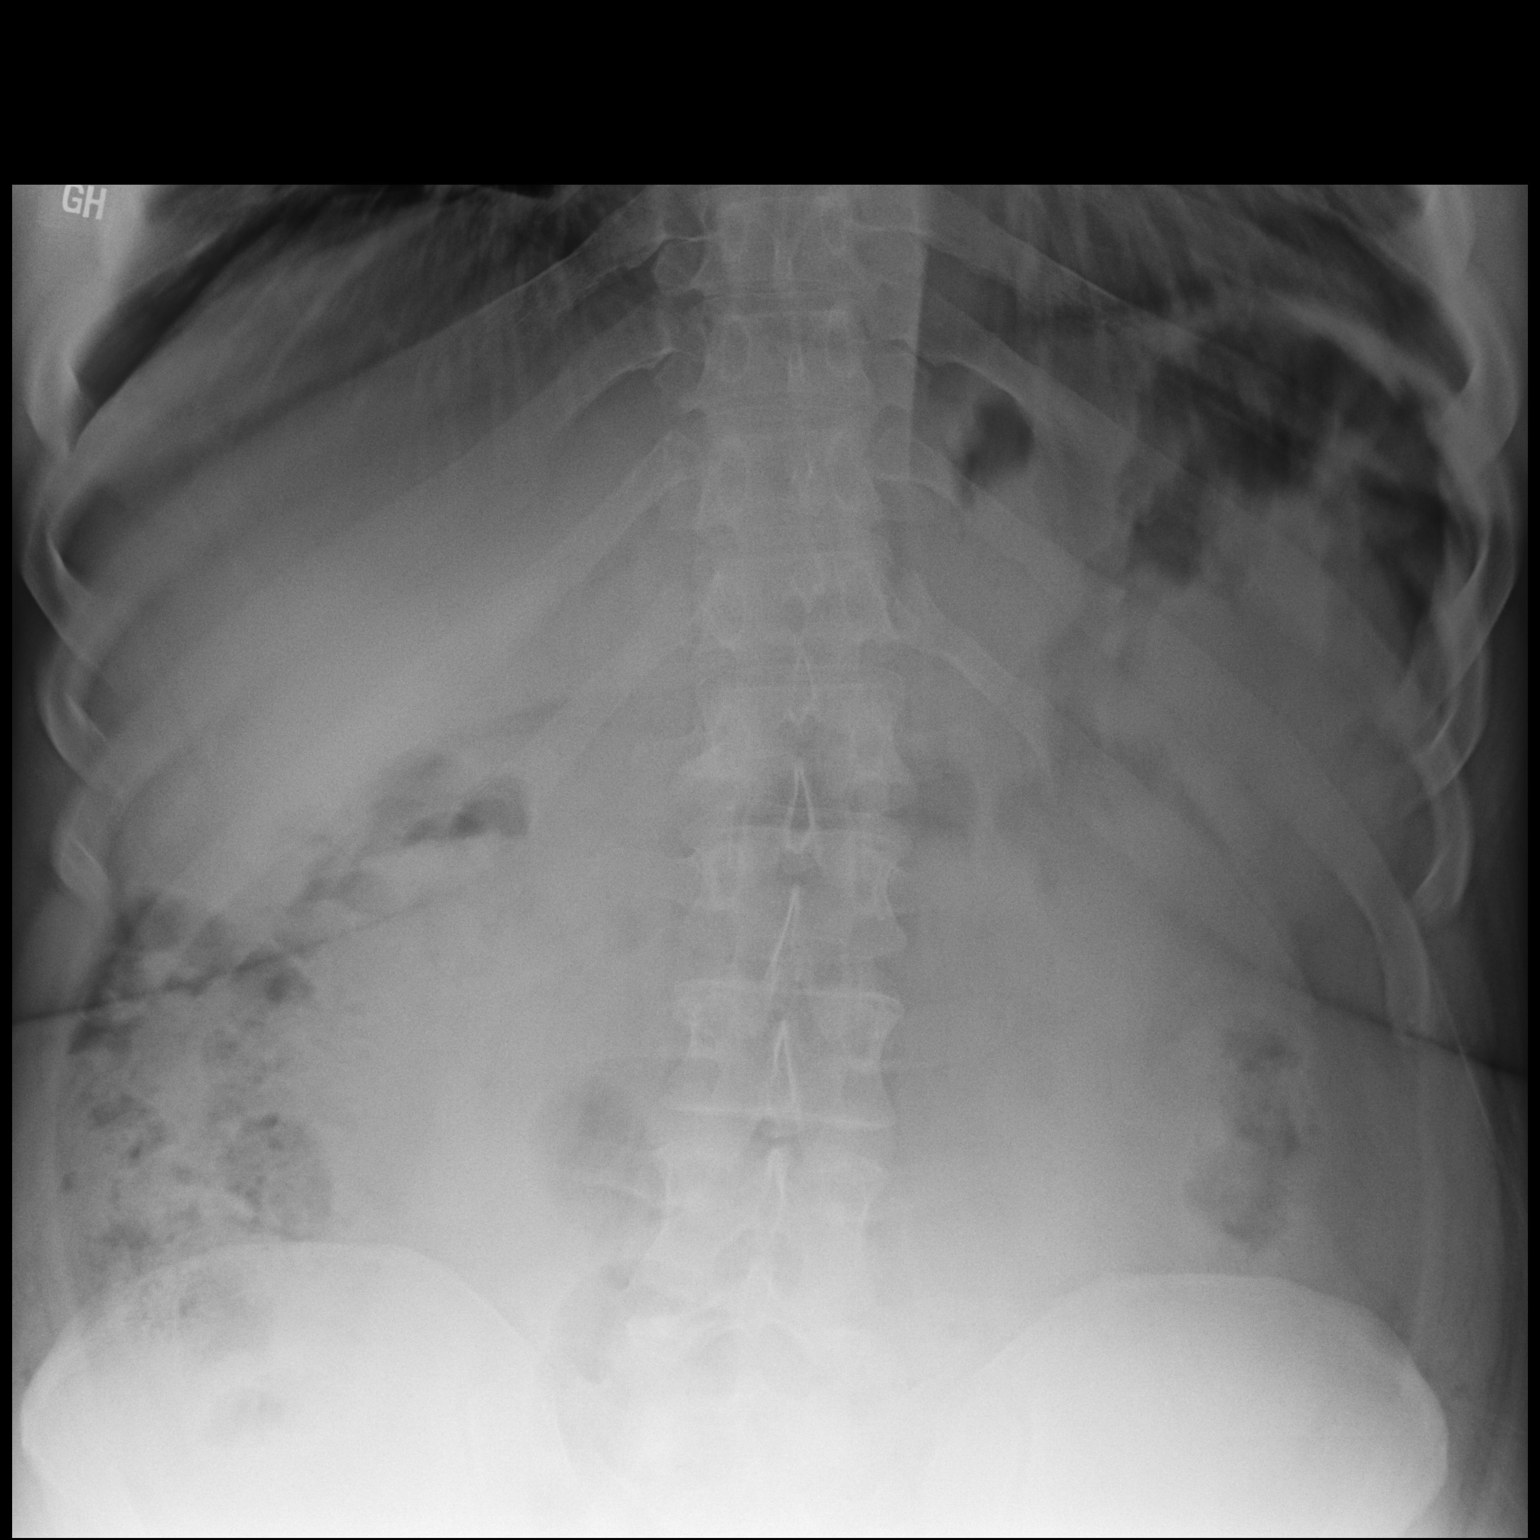

[view not recorded (2 of 2)]
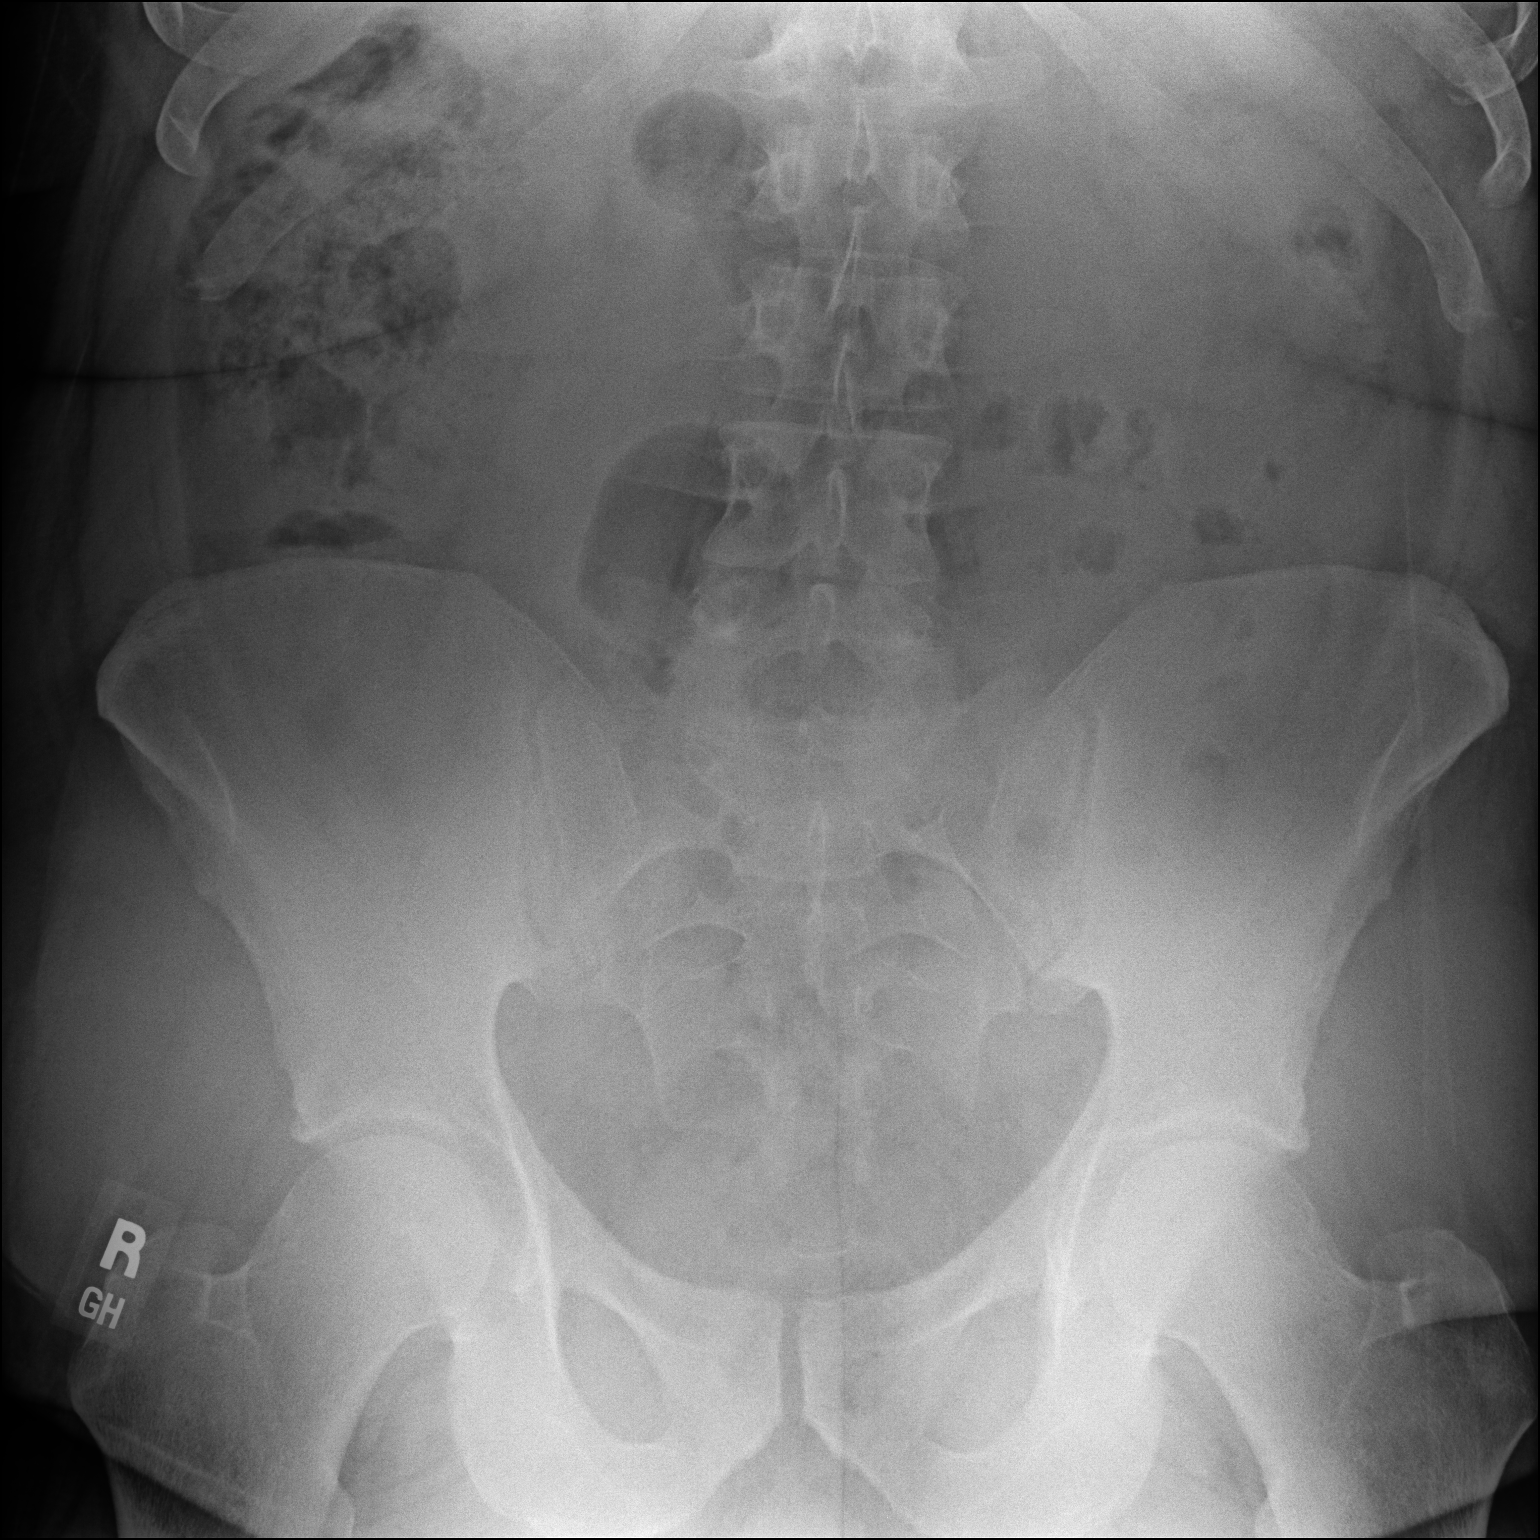

[2 of 2 positions shown; findings below may reference images not displayed]

FINDINGS: The bowel gas pattern is normal. Moderate bowel content is
identified in the colon. No radio-opaque calculi or other
significant radiographic abnormality are seen.
IMPRESSION: No bowel obstruction. Moderate bowel content identified in the
colon.

## 2022-07-01 ENCOUNTER — Ambulatory Visit
Admission: RE | Admit: 2022-07-01 | Discharge: 2022-07-01 | Disposition: A | Discharge status: Home / Self Care | Payer: BC Managed Care – PPO | Source: Ambulatory Visit | Attending: Gastroenterology | Admitting: Gastroenterology

## 2022-07-01 ENCOUNTER — Other Ambulatory Visit: Payer: Self-pay | Admitting: Gastroenterology

## 2022-07-01 DIAGNOSIS — R195 Other fecal abnormalities: Secondary | ICD-10-CM

## 2022-07-01 DIAGNOSIS — R197 Diarrhea, unspecified: Secondary | ICD-10-CM | POA: Diagnosis not present

## 2022-07-01 DIAGNOSIS — R140 Abdominal distension (gaseous): Secondary | ICD-10-CM | POA: Diagnosis not present

## 2022-07-14 DIAGNOSIS — J029 Acute pharyngitis, unspecified: Secondary | ICD-10-CM | POA: Diagnosis not present

## 2022-07-14 DIAGNOSIS — Z20818 Contact with and (suspected) exposure to other bacterial communicable diseases: Secondary | ICD-10-CM | POA: Diagnosis not present

## 2022-07-15 DIAGNOSIS — J029 Acute pharyngitis, unspecified: Secondary | ICD-10-CM | POA: Diagnosis not present

## 2022-08-28 DIAGNOSIS — E039 Hypothyroidism, unspecified: Secondary | ICD-10-CM | POA: Diagnosis not present

## 2022-08-28 DIAGNOSIS — E7211 Homocystinuria: Secondary | ICD-10-CM | POA: Diagnosis not present

## 2022-08-28 DIAGNOSIS — R635 Abnormal weight gain: Secondary | ICD-10-CM | POA: Diagnosis not present

## 2022-08-28 DIAGNOSIS — E7841 Elevated Lipoprotein(a): Secondary | ICD-10-CM | POA: Diagnosis not present

## 2022-09-21 DIAGNOSIS — L821 Other seborrheic keratosis: Secondary | ICD-10-CM | POA: Diagnosis not present

## 2022-09-21 DIAGNOSIS — L814 Other melanin hyperpigmentation: Secondary | ICD-10-CM | POA: Diagnosis not present

## 2022-09-21 DIAGNOSIS — D225 Melanocytic nevi of trunk: Secondary | ICD-10-CM | POA: Diagnosis not present

## 2022-09-25 DIAGNOSIS — K219 Gastro-esophageal reflux disease without esophagitis: Secondary | ICD-10-CM | POA: Diagnosis not present

## 2022-09-25 DIAGNOSIS — R1013 Epigastric pain: Secondary | ICD-10-CM | POA: Diagnosis not present

## 2022-10-23 DIAGNOSIS — K219 Gastro-esophageal reflux disease without esophagitis: Secondary | ICD-10-CM | POA: Diagnosis not present

## 2022-10-23 DIAGNOSIS — F411 Generalized anxiety disorder: Secondary | ICD-10-CM | POA: Diagnosis not present

## 2022-10-23 DIAGNOSIS — R002 Palpitations: Secondary | ICD-10-CM | POA: Diagnosis not present

## 2022-12-28 ENCOUNTER — Other Ambulatory Visit: Payer: Self-pay

## 2022-12-28 ENCOUNTER — Ambulatory Visit
Admission: EM | Admit: 2022-12-28 | Discharge: 2022-12-28 | Disposition: A | Discharge status: Home / Self Care | Payer: BC Managed Care – PPO

## 2022-12-28 DIAGNOSIS — R079 Chest pain, unspecified: Secondary | ICD-10-CM | POA: Diagnosis not present

## 2022-12-28 NOTE — ED Triage Notes (Signed)
Started a week ago having pressure in his chest on right side, that comes and goes, notices it more if he lays to his side. Started having sharp pain in that area to. Taking acid reflux medication, Tums and Pepcid, and tylenol to try and help the pain.

## 2022-12-28 NOTE — ED Provider Notes (Signed)
[facility]    CSN: [id number] Arrival date & time: 12/28/22  1734      History   Chief Complaint Chief Complaint  Patient presents with   Chest Pain    HPI GRAYLAND DAISEY is a 32 y.o. male.   Patient presenting today with almost a week of right-sided chest pressure, intermittent pains that he notices more if he lays on his side in a particular way but states overall they seem fairly random when they come and go.  Denies any alleviating factors, associated palpitations, shortness of breath, wheezing, dizziness, diaphoresis, nausea, vomiting.  Does have a history of H. pylori for which she has been on antibiotics for several months and has been taking his reflux medications as well as Tums and Pepcid in case his reflux was causing the pain.  No benefit with this or Tylenol.    Past Medical History:  Diagnosis Date   Anxiety    Chronic pain of left wrist 10/07/2015   Cough 03/11/2012   GERD (gastroesophageal reflux disease)    Obesity (BMI 30-39.9) 04/22/2016   OSA (obstructive sleep apnea) 02/19/2016   Mild with AHI 9.4/hr   Panic attack    PVC (premature ventricular contraction) 04/22/2016   Snoring 04/22/2016   Stuffy and runny nose 03/11/2012   clear drainage from nose   Tenosynovitis of hand 02/2012   left    Patient Active Problem List   Diagnosis Date Noted   Palpitation 10/29/2021   PAC (premature atrial contraction) 10/29/2021   Class 3 severe obesity due to excess calories without serious comorbidity with body mass index (BMI) of 45.0 to 49.9 in adult (HCC) 10/29/2021   PVC (premature ventricular contraction) 04/22/2016   Snoring 04/22/2016   Chest pain 04/22/2016   Obesity (BMI 30-39.9) 04/22/2016   Allergic rhinitis 03/09/2016   Heartburn 03/09/2016   OSA (obstructive sleep apnea) 02/19/2016   Chronic pain of left wrist 10/07/2015   Panic attack 08/11/2013    Past Surgical History:  Procedure Laterality Date   WISDOM TOOTH EXTRACTION          Home Medications    Prior to Admission medications   Medication Sig Start Date End Date Taking? Authorizing Provider  famotidine (PEPCID) 20 MG tablet Take 20 mg by mouth 2 (two) times daily.   Yes [provider]  fluticasone (FLONASE ALLERGY RELIEF) 50 MCG/ACT nasal spray 1 spray in each nostril    [provider]  loratadine (CLARITIN) 10 MG tablet Take 10 mg by mouth daily.    [provider]  SUMAtriptan (IMITREX) 100 MG tablet Take 1 tablet earliest onset of migraine.  May repeat in 2 hours if headache persists or recurs.  Maximum 2 tablets in 24 hours. 04/08/22   Drema Dallas, DO    Family History Family History  Problem Relation Age of Onset   Hypertension Mother    Anxiety disorder Mother    Allergic rhinitis Mother    Hypertension Father    Heart attack Maternal Grandmother    Heart attack Maternal Grandfather    Stroke Maternal Grandfather    Cancer Paternal Grandmother    Cancer Paternal Grandfather    Asthma Brother    Allergic rhinitis Brother    Angioedema Neg Hx    Atopy Neg Hx    Eczema Neg Hx    Immunodeficiency Neg Hx    Urticaria Neg Hx     Social History Social History   Tobacco Use  Smoking status: Never   Smokeless tobacco: Current    Types: Snuff    Last attempt to quit: 02/19/2015  Vaping Use   Vaping Use: Never used  Substance Use Topics   Alcohol use: No    Alcohol/week: 0.0 standard drinks of alcohol    Comment: occasionally   Drug use: No     Allergies   Tramadol, Dexlansoprazole, and Esomeprazole magnesium   Review of Systems Review of Systems Per HPI  Physical Exam Triage Vital Signs ED Triage Vitals  Enc Vitals Group     BP 12/28/22 1841 (!) 135/91     Pulse Rate 12/28/22 1841 96     Resp 12/28/22 1841 16     Temp 12/28/22 1841 98.4 F (36.9 C)     Temp Source 12/28/22 1841 Oral     SpO2 12/28/22 1841 94 %     Weight --      Height --      Head Circumference --      Peak Flow  --      Pain Score 12/28/22 1856 2     Pain Loc --      Pain Edu? --      Excl. in GC? --    No data found.  Updated Vital Signs BP (!) 135/91 (BP Location: Right Arm)   Pulse 96   Temp 98.4 F (36.9 C) (Oral)   Resp 16   SpO2 94%   Visual Acuity Right Eye Distance:   Left Eye Distance:   Bilateral Distance:    Right Eye Near:   Left Eye Near:    Bilateral Near:     Physical Exam Vitals and nursing note reviewed.  Constitutional:      Appearance: Normal appearance.  HENT:     Head: Atraumatic.     Mouth/Throat:     Mouth: Mucous membranes are moist.  Eyes:     Extraocular Movements: Extraocular movements intact.     Conjunctiva/sclera: Conjunctivae normal.  Cardiovascular:     Rate and Rhythm: Normal rate and regular rhythm.  Pulmonary:     Effort: Pulmonary effort is normal.     Breath sounds: Normal breath sounds. No wheezing or rales.  Chest:     Chest wall: No tenderness.  Musculoskeletal:        General: No tenderness. Normal range of motion.     Cervical back: Normal range of motion and neck supple.  Skin:    General: Skin is warm and dry.     Findings: No bruising or erythema.  Neurological:     General: No focal deficit present.     Mental Status: He is oriented to person, place, and time.  Psychiatric:        Mood and Affect: Mood normal.        Thought Content: Thought content normal.        Judgment: Judgment normal.      UC Treatments / Results  Labs (all labs ordered are listed, but only abnormal results are displayed) Labs Reviewed - No data to display  EKG   Radiology No results found.  Procedures Procedures (including critical care time)  Medications Ordered in UC Medications - No data to display  Initial Impression / Assessment and Plan / UC Course  I have reviewed the triage vital signs and the nursing notes.  Pertinent labs & imaging results that were available during my care of the patient were reviewed by me and  considered in  my medical decision making (see chart for details).     Minimally hypertensive in triage, otherwise vital signs reassuring.  Exam very reassuring with no abnormalities noted today.  EKG showing normal sinus rhythm at 83 bpm without acute ST or T wave changes.  Possibly some costochondritis type pain, discussed treatment with anti-inflammatories, heat, stretches, rest.  Follow-up for worsening symptoms.  Final Clinical Impressions(s) / UC Diagnoses   Final diagnoses:  Right-sided chest pain   Discharge Instructions   None    ED Prescriptions   None    PDMP not reviewed this encounter.   Volney American, [place] 12/28/22 1951

## 2023-02-15 DIAGNOSIS — G4733 Obstructive sleep apnea (adult) (pediatric): Secondary | ICD-10-CM | POA: Diagnosis not present

## 2023-02-16 ENCOUNTER — Ambulatory Visit
Admission: EM | Admit: 2023-02-16 | Discharge: 2023-02-16 | Disposition: A | Discharge status: Home / Self Care | Payer: BC Managed Care – PPO | Attending: Urgent Care | Admitting: Urgent Care

## 2023-02-16 DIAGNOSIS — J018 Other acute sinusitis: Secondary | ICD-10-CM

## 2023-02-16 MED ORDER — CETIRIZINE HCL 10 MG PO TABS
10.0000 mg | ORAL_TABLET | Freq: Every day | ORAL | 0 refills | Status: DC
Start: 2023-02-16 — End: 2023-04-28

## 2023-02-16 MED ORDER — PROMETHAZINE-DM 6.25-15 MG/5ML PO SYRP: 5.0000 mL | ORAL_SOLUTION | Freq: Three times a day (TID) | ORAL | 0 refills | Status: DC | PRN

## 2023-02-16 MED ORDER — PSEUDOEPHEDRINE HCL 60 MG PO TABS
60.0000 mg | ORAL_TABLET | Freq: Three times a day (TID) | ORAL | 0 refills | Status: DC | PRN
Start: 2023-02-16 — End: 2023-08-11

## 2023-02-16 MED ORDER — AMOXICILLIN 875 MG PO TABS
875.0000 mg | ORAL_TABLET | Freq: Two times a day (BID) | ORAL | 0 refills | Status: DC
Start: 2023-02-16 — End: 2023-03-30

## 2023-02-16 NOTE — ED Provider Notes (Signed)
[facility]  Note:  This document was prepared using Systems analyst and may include unintentional dictation errors.  MRN: [id number] DOB: April 01, 1991  Subjective:   Derrick Hart is a 32 y.o. male presenting for 2-week history of persistent throat pain, congestion, sinus drainage and is starting to have a more bothersome cough that is worse overnight.  Has been using DayQuil and NyQuil with minimal relief.  Has a history of allergic rhinitis.  Has concerns about taking medications.  Wanted to know if he would be able to clear this without medicines.  No asthma.  No current facility-administered medications for this encounter.  Current Outpatient Medications:    famotidine (PEPCID) 20 MG tablet, Take 20 mg by mouth 2 (two) times daily., Disp: , Rfl:    fluticasone (FLONASE ALLERGY RELIEF) 50 MCG/ACT nasal spray, 1 spray in each nostril, Disp: , Rfl:    loratadine (CLARITIN) 10 MG tablet, Take 10 mg by mouth daily., Disp: , Rfl:    SUMAtriptan (IMITREX) 100 MG tablet, Take 1 tablet earliest onset of migraine.  May repeat in 2 hours if headache persists or recurs.  Maximum 2 tablets in 24 hours., Disp: 10 tablet, Rfl: 0   Allergies  Allergen Reactions   Tramadol Shortness Of Breath   Dexlansoprazole     Other reaction(s): heart skip   Esomeprazole Magnesium     Other reaction(s): heart palpitations    Past Medical History:  Diagnosis Date   Anxiety    Chronic pain of left wrist 10/07/2015   Cough 03/11/2012   GERD (gastroesophageal reflux disease)    Obesity (BMI 30-39.9) 04/22/2016   OSA (obstructive sleep apnea) 02/19/2016   Mild with AHI 9.4/hr   Panic attack    PVC (premature ventricular contraction) 04/22/2016   Snoring 04/22/2016   Stuffy and runny nose 03/11/2012   clear drainage from nose   Tenosynovitis of hand 02/2012   left     Past Surgical History:  Procedure Laterality Date   WISDOM TOOTH EXTRACTION      Family History   Problem Relation Age of Onset   Hypertension Mother    Anxiety disorder Mother    Allergic rhinitis Mother    Hypertension Father    Heart attack Maternal Grandmother    Heart attack Maternal Grandfather    Stroke Maternal Grandfather    Cancer Paternal Grandmother    Cancer Paternal Grandfather    Asthma Brother    Allergic rhinitis Brother    Angioedema Neg Hx    Atopy Neg Hx    Eczema Neg Hx    Immunodeficiency Neg Hx    Urticaria Neg Hx     Social History   Tobacco Use   Smoking status: Never   Smokeless tobacco: Current    Types: Snuff    Last attempt to quit: 02/19/2015  Vaping Use   Vaping Use: Never used  Substance Use Topics   Alcohol use: No    Alcohol/week: 0.0 standard drinks of alcohol    Comment: occasionally   Drug use: No    ROS   Objective:   Vitals: BP 109/71 (BP Location: Right Arm)   Pulse 99   Temp 98 F (36.7 C) (Oral)   Resp 20   SpO2 97%   Physical Exam Constitutional:      General: He is not in acute distress.    Appearance: Normal appearance. He is well-developed and normal weight. He is not ill-appearing, toxic-appearing or  diaphoretic.  HENT:     Head: Normocephalic and atraumatic.     Right Ear: Tympanic membrane, ear canal and external ear normal. No drainage, swelling or tenderness. No middle ear effusion. There is no impacted cerumen. Tympanic membrane is not erythematous or bulging.     Left Ear: Tympanic membrane, ear canal and external ear normal. No drainage, swelling or tenderness.  No middle ear effusion. There is no impacted cerumen. Tympanic membrane is not erythematous or bulging.     Nose: Congestion present. No rhinorrhea.     Mouth/Throat:     Mouth: Mucous membranes are moist.     Pharynx: No oropharyngeal exudate or posterior oropharyngeal erythema.  Eyes:     General: No scleral icterus.       Right eye: No discharge.        Left eye: No discharge.     Extraocular Movements: Extraocular movements intact.      Conjunctiva/sclera: Conjunctivae normal.  Cardiovascular:     Rate and Rhythm: Normal rate and regular rhythm.     Heart sounds: Normal heart sounds. No murmur heard.    No friction rub. No gallop.  Pulmonary:     Effort: Pulmonary effort is normal. No respiratory distress.     Breath sounds: Normal breath sounds. No stridor. No wheezing, rhonchi or rales.  Musculoskeletal:     Cervical back: Normal range of motion and neck supple. No rigidity. No muscular tenderness.  Neurological:     General: No focal deficit present.     Mental Status: He is alert and oriented to person, place, and time.  Psychiatric:        Mood and Affect: Mood normal.        Behavior: Behavior normal.        Thought Content: Thought content normal.     Assessment and Plan :   PDMP not reviewed this encounter.  1. Acute non-recurrent sinusitis of other sinus     Deferred imaging given clear cardiopulmonary exam, hemodynamically stable vital signs.  I advised patient that with the timeline of his illness, he is unlikely to clear his symptoms without medications.  Recommended he start empiric treatment for sinusitis with amoxicillin.  Recommended supportive care otherwise. Counseled patient on potential for adverse effects with medications prescribed/recommended today, ER and return-to-clinic precautions discussed, patient verbalized understanding.    Jaynee Eagles, PA-C 02/16/23 1252

## 2023-02-16 NOTE — ED Triage Notes (Signed)
Pt c/o nasal congestion, cough, sore throat-sx stared 2 weeks ago-worse x 2-3 days-NAD-steady gait

## 2023-03-16 DIAGNOSIS — G4733 Obstructive sleep apnea (adult) (pediatric): Secondary | ICD-10-CM | POA: Diagnosis not present

## 2023-03-16 DIAGNOSIS — R002 Palpitations: Secondary | ICD-10-CM | POA: Diagnosis not present

## 2023-03-16 DIAGNOSIS — K219 Gastro-esophageal reflux disease without esophagitis: Secondary | ICD-10-CM | POA: Diagnosis not present

## 2023-03-16 DIAGNOSIS — F411 Generalized anxiety disorder: Secondary | ICD-10-CM | POA: Diagnosis not present

## 2023-03-30 ENCOUNTER — Ambulatory Visit: Payer: BC Managed Care – PPO | Admitting: Cardiology

## 2023-03-30 ENCOUNTER — Other Ambulatory Visit: Payer: BC Managed Care – PPO

## 2023-03-30 ENCOUNTER — Encounter: Payer: Self-pay | Admitting: Cardiology

## 2023-03-30 VITALS — BP 124/87 | HR 88 | Ht 76.0 in | Wt 365.0 lb

## 2023-03-30 DIAGNOSIS — Z6841 Body Mass Index (BMI) 40.0 and over, adult: Secondary | ICD-10-CM | POA: Diagnosis not present

## 2023-03-30 DIAGNOSIS — G4733 Obstructive sleep apnea (adult) (pediatric): Secondary | ICD-10-CM

## 2023-03-30 DIAGNOSIS — R030 Elevated blood-pressure reading, without diagnosis of hypertension: Secondary | ICD-10-CM

## 2023-03-30 DIAGNOSIS — R002 Palpitations: Secondary | ICD-10-CM | POA: Diagnosis not present

## 2023-03-30 NOTE — Progress Notes (Unsigned)
Primary Physician/Referring:  Tally JoeSwayne, David, MD  Patient ID: Derrick Hart, male    DOB: January 24, 1991, 32 y.o.   MRN: [id number]  No chief complaint on file.  HPI:    Derrick Finlandhomas S Brake  is a 32 y.o. male with a significant PMH of but not limited to obesity, H. Pylori, GERD, and OSA. He was recently seen by PCP for palpitations that have been ongoing for 4-5 years. Of note, he reports he saw a cardiologist (Dr. Cristal Deerhristopher) back in 2022 and wore a Holter monitor x1 week that resulted with PVCs, PACs, and skipped beats, however, no treatment recommended. He has been experiencing waking up in the middle of the night d/t rapid heart rates and "gasping". Of note he is being worked up by Sleep Medicine at [facility] for OSA and is on CPAP. He also had a ER visit for right sided chest pain 12/28/2022 in which was ruled to be costochondritis.  Today he admits to having an increase of "skipped beats" and a "funny feeling in his chest". He notes that over the last 4-5 months he has noticed a lack of sleep d/t apnea/palpitations. He described the chest feeling to be centralized and light, and described the palpitations to be pounding, with no radiation. He admits palpitations to be worse when lying flat, with stress, and when being active. He states they are intermittent and most days experiences at least 10 episodes palpitations a day, that subside on their own.   Denies any ETOH or caffeine. He admits to a family history of Mother: HTN, Father: HTN, Maternal Grandmother: Heart Attack, Maternal Grandfather: Heart Attack and Stoke.   Past Medical History:  Diagnosis Date   Anxiety    Chronic pain of left wrist 10/07/2015   Cough 03/11/2012   GERD (gastroesophageal reflux disease)    Obesity (BMI 30-39.9) 04/22/2016   OSA (obstructive sleep apnea) 02/19/2016   Mild with AHI 9.4/hr   Panic attack    PVC (premature ventricular contraction) 04/22/2016   Snoring 04/22/2016   Stuffy and runny nose 03/11/2012   clear  drainage from nose   Tenosynovitis of hand 02/2012   left   Past Surgical History:  Procedure Laterality Date   WISDOM TOOTH EXTRACTION     Family History  Problem Relation Age of Onset   Hypertension Mother    Anxiety disorder Mother    Allergic rhinitis Mother    Hypertension Father    Heart attack Maternal Grandmother    Heart attack Maternal Grandfather    Stroke Maternal Grandfather    Cancer Paternal Grandmother    Cancer Paternal Grandfather    Asthma Brother    Allergic rhinitis Brother    Angioedema Neg Hx    Atopy Neg Hx    Eczema Neg Hx    Immunodeficiency Neg Hx    Urticaria Neg Hx     Social History   Tobacco Use   Smoking status: Never   Smokeless tobacco: Current    Types: Snuff    Last attempt to quit: 02/19/2015  Substance Use Topics   Alcohol use: No    Alcohol/week: 0.0 standard drinks of alcohol    Comment: occasionally   Marital Status: Married  ROS  ***Review of Systems  Constitutional: Negative.  Cardiovascular:  Positive for irregular heartbeat and palpitations. Negative for chest pain, dyspnea on exertion, paroxysmal nocturnal dyspnea and syncope.  Respiratory: Negative.    Musculoskeletal: Negative.   Neurological: Negative.   Psychiatric/Behavioral: Negative.  Objective      02/16/2023   12:36 PM 12/28/2022    6:41 PM 04/08/2022    3:11 PM  Vitals with BMI  Height   6\' 4"   Weight   368 lbs  BMI   44.81  Systolic 109 135 329  Diastolic 71 91 80  Pulse 99 96 76       ***Physical Exam Cardiovascular:     Rate and Rhythm: Normal rate.     Pulses: Normal pulses.     Heart sounds: Normal heart sounds. No murmur heard.    No gallop.  Pulmonary:     Effort: Pulmonary effort is normal.     Breath sounds: Normal breath sounds.  Musculoskeletal:     Right lower leg: No edema.     Left lower leg: No edema.  Skin:    General: Skin is warm and dry.  Neurological:     Mental Status: He is alert.  Psychiatric:        Mood and  Affect: Mood normal.     Laboratory examination:   No results for input(s): "NA", "K", "CL", "CO2", "GLUCOSE", "BUN", "CREATININE", "CALCIUM", "GFRNONAA", "GFRAA" in the last 8760 hours.  Lab Results  Component Value Date   GLUCOSE 110 (H) 08/30/2021   NA 137 08/30/2021   K 4.2 08/30/2021   CL 104 08/30/2021   CO2 25 08/30/2021   BUN 14 08/30/2021   CREATININE 0.80 08/30/2021   GFRNONAA >60 08/30/2021   CALCIUM 9.5 08/30/2021   PROT 7.5 04/08/2022   ANIONGAP 8 08/30/2021      No results found for: "ALT", "AST", "GGT", "ALKPHOS", "BILITOT"     Latest Ref Rng & Units 04/08/2022    3:24 PM  Hepatic Function  Total Protein 6.1 - 8.1 g/dL 7.5     Lipid Panel No results for input(s): "CHOL", "TRIG", "LDLCALC", "VLDL", "HDL", "CHOLHDL", "LDLDIRECT" in the last 8760 hours.  HEMOGLOBIN A1C No results found for: "HGBA1C", "MPG" TSH No results for input(s): "TSH" in the last 8760 hours.  External labs: 03/17/2023: Glucose 77, BUN/Cr 16/0.81. EGFR 119. Na/K 139/4.5. H/H 13.6/41.2. MCV 88.9. Platelets 187  08/30/2021 TSH 3.686  Radiology:   Zio Patch Monitor 10/29/21-11/11/21   Patient had a min HR of 46 bpm, max HR of 151 bpm, and avg HR of 79 bpm. Predominant underlying rhythm was Sinus Rhythm. No VT, SVT, atrial fibrillation, high degree block, or pauses noted. Isolated atrial and ventricular ectopy was rare (<1%). There were 22 triggered events, predominantly sinus rhythm with/without ectopic beats. No significant arrhythmias detected.   Echocardiogram 10/29/2015  - Left ventricle: The cavity size was normal. Wall thickness was    increased in a pattern of mild LVH. Systolic function was normal.    The estimated ejection fraction was in the range of 55% to 60%.    Wall motion was normal; there were no regional wall motion    abnormalities. Left ventricular diastolic function parameters    were normal.  - Left atrium: The atrium was mildly dilated.  - Right ventricle:  The cavity size was normal. Wall thickness was    normal. Systolic function was normal.  - Inferior vena cava: The vessel was normal in size. The    respirophasic diameter changes were in the normal range (>= 50%),    consistent with normal central venous pressure.    EKG:   ***  *** Medications and allergies   Allergies  Allergen Reactions   Tramadol Shortness Of  Breath   Dexlansoprazole     Other reaction(s): heart skip   Esomeprazole Magnesium     Other reaction(s): heart palpitations     Medication list   Current Outpatient Medications:    amoxicillin (AMOXIL) 875 MG tablet, Take 1 tablet (875 mg total) by mouth 2 (two) times daily., Disp: 14 tablet, Rfl: 0   cetirizine (ZYRTEC ALLERGY) 10 MG tablet, Take 1 tablet (10 mg total) by mouth daily., Disp: 30 tablet, Rfl: 0   famotidine (PEPCID) 20 MG tablet, Take 20 mg by mouth 2 (two) times daily., Disp: , Rfl:    fluticasone (FLONASE ALLERGY RELIEF) 50 MCG/ACT nasal spray, 1 spray in each nostril, Disp: , Rfl:    loratadine (CLARITIN) 10 MG tablet, Take 10 mg by mouth daily., Disp: , Rfl:    promethazine-dextromethorphan (PROMETHAZINE-DM) 6.25-15 MG/5ML syrup, Take 5 mLs by mouth 3 (three) times daily as needed for cough., Disp: 200 mL, Rfl: 0   pseudoephedrine (SUDAFED) 60 MG tablet, Take 1 tablet (60 mg total) by mouth every 8 (eight) hours as needed for congestion., Disp: 30 tablet, Rfl: 0   SUMAtriptan (IMITREX) 100 MG tablet, Take 1 tablet earliest onset of migraine.  May repeat in 2 hours if headache persists or recurs.  Maximum 2 tablets in 24 hours., Disp: 10 tablet, Rfl: 0  Assessment  No diagnosis found.   No orders of the defined types were placed in this encounter.   No orders of the defined types were placed in this encounter.   There are no discontinued medications.   Recommendations:   TYRESSE JAYSON  is a 32 y.o. male with a significant PMH of but not limited to obesity, H. Pylori, GERD, and OSA. He was  recently seen by PCP for palpitations that have been ongoing for 4-5 years. Of note, he reports he saw a cardiologist (Dr. Cristal Deer) back in 2022 and wore a Holter monitor x1 week that resulted with PVCs, PACs, and skipped beats, however, no treatment recommended. He has been experiencing waking up in the middle of the night d/t rapid heart rates and "gasping". Of note he is being worked up by Sleep Medicine at [facility] for OSA and is on CPAP. He also had a ER visit for right sided chest pain 12/28/2022 in which was ruled to be costochondritis.  1. Palpitations EKG personally reviewed with no evidence of injury pattern. Lab work personally reviewed with no electrolyte abnormalities Previous Zio Monitor results reviewed with <1% ectopy, otherwise no significant rhythm issues.  Patient does not admit to any anginal equivalent symptoms, or shortness of breath Patient does not want to start any medications at this time.  Plan  - Zio monitor for 2 weeks  2. Class 3 severe obesity due to excess calories without serious comorbidity with body mass index (BMI) of 45.0 to 49.9 in adult Patient admits to weight loss in the last year. I have educated him on the importance of a heat healthy diet and exercise. I do believe with exercise and weight loss that his sleep apnea will start to improve.   3. OSA (obstructive sleep apnea) Patient states he is compliant with his CPAP. He is still having apnea episodes despite CPAP. He is followed by sleep medicine. I have encouraged to follow back with them in regards to his sleep apnea.   4. Elevated blood pressure reading in office without diagnosis of hypertension BP elevated in the office today. Encouraged to continue weight loss and salt avoidance.  Please follow up in 4 weeks or sooner if needed  Royston Bake, NP-S   Yates Decamp, MD, [facility] 03/30/2023, 12:56 PM Office: [phone]

## 2023-03-31 ENCOUNTER — Encounter: Payer: Self-pay | Admitting: Cardiology

## 2023-04-15 DIAGNOSIS — R002 Palpitations: Secondary | ICD-10-CM | POA: Diagnosis not present

## 2023-04-20 DIAGNOSIS — R002 Palpitations: Secondary | ICD-10-CM | POA: Diagnosis not present

## 2023-04-28 ENCOUNTER — Encounter: Payer: Self-pay | Admitting: Cardiology

## 2023-04-28 ENCOUNTER — Ambulatory Visit: Payer: BC Managed Care – PPO | Admitting: Cardiology

## 2023-04-28 VITALS — BP 126/84 | HR 93 | Ht 76.0 in | Wt 361.0 lb

## 2023-04-28 DIAGNOSIS — R002 Palpitations: Secondary | ICD-10-CM

## 2023-04-28 DIAGNOSIS — Z6841 Body Mass Index (BMI) 40.0 and over, adult: Secondary | ICD-10-CM | POA: Diagnosis not present

## 2023-04-28 DIAGNOSIS — R030 Elevated blood-pressure reading, without diagnosis of hypertension: Secondary | ICD-10-CM | POA: Diagnosis not present

## 2023-04-28 DIAGNOSIS — E66813 Obesity, class 3: Secondary | ICD-10-CM

## 2023-04-28 NOTE — Progress Notes (Signed)
Primary Physician/Referring:  Tally Joe, MD  Patient ID: Derrick Hart, male    DOB: 1991/08/31, 32 y.o.   MRN: [id number]  Chief Complaint  Patient presents with   Palpitations   Follow-up   HPI:    Derrick Hart  is a 32 y.o. male with a significant PMH of morbid obesity with OSA on CPAP, GERD, H/O H. Pylori infection, chronic palpitations felt to be due to PACs and PVCs by Zio patch monitoring in 2022, presents for follow-up after he was evaluated by me 2 months ago.  Underwent Zio patch monitoring and presents for follow-up.   There is no history of tobacco use, alcohol use.  No history of hypertension or diabetes.   Past Medical History:  Diagnosis Date   Anxiety    Chronic pain of left wrist 10/07/2015   Cough 03/11/2012   GERD (gastroesophageal reflux disease)    Obesity (BMI 30-39.9) 04/22/2016   OSA (obstructive sleep apnea) 02/19/2016   Mild with AHI 9.4/hr   Panic attack    PVC (premature ventricular contraction) 04/22/2016   Snoring 04/22/2016   Stuffy and runny nose 03/11/2012   clear drainage from nose   Tenosynovitis of hand 02/2012   left   Past Surgical History:  Procedure Laterality Date   WISDOM TOOTH EXTRACTION     Family History  Problem Relation Age of Onset   Hypertension Mother    Anxiety disorder Mother    Allergic rhinitis Mother    Hypertension Father    Heart attack Maternal Grandmother    Heart attack Maternal Grandfather    Stroke Maternal Grandfather    Cancer Paternal Grandmother    Cancer Paternal Grandfather    Asthma Brother    Allergic rhinitis Brother    Angioedema Neg Hx    Atopy Neg Hx    Eczema Neg Hx    Immunodeficiency Neg Hx    Urticaria Neg Hx     Social History   Tobacco Use   Smoking status: Never   Smokeless tobacco: Current    Types: Snuff    Last attempt to quit: 02/19/2015  Substance Use Topics   Alcohol use: No    Alcohol/week: 0.0 standard drinks of alcohol    Comment: occasionally   Marital Status:  Married  ROS  Review of Systems  Constitutional: Negative.  Cardiovascular:  Positive for irregular heartbeat and palpitations. Negative for chest pain, dyspnea on exertion, paroxysmal nocturnal dyspnea and syncope.  Respiratory: Negative.    Musculoskeletal: Negative.   Neurological: Negative.   Psychiatric/Behavioral: Negative.     Objective      04/28/2023    3:49 PM 03/30/2023    1:24 PM 02/16/2023   12:36 PM  Vitals with BMI  Height 6\' 4"  6\' 4"    Weight 361 lbs 365 lbs   BMI 43.96 44.45   Systolic 126 124 562  Diastolic 84 87 71  Pulse 93 88 99   SpO2: 96 %  Physical Exam Cardiovascular:     Rate and Rhythm: Normal rate.     Pulses: Normal pulses.     Heart sounds: Normal heart sounds. No murmur heard.    No gallop.  Pulmonary:     Effort: Pulmonary effort is normal.     Breath sounds: Normal breath sounds.  Musculoskeletal:     Right lower leg: No edema.     Left lower leg: No edema.  Skin:    General: Skin is warm and dry.  Neurological:  Mental Status: He is alert.  Psychiatric:        Mood and Affect: Mood normal.    Laboratory examination:   External labs: 03/17/2023: Glucose 77, BUN/Cr 16/0.81. EGFR 119. Na/K 139/4.5. H/H 13.6/41.2. MCV 88.9. Platelets 187  08/30/2021 TSH 3.686  Cardiac studies:    Echocardiogram 10/29/2015 - Left ventricle: The cavity size was normal. Wall thickness was    increased in a pattern of mild LVH. Systolic function was normal.   The estimated ejection fraction was in the range of 55% to 60%.    Wall motion was normal; there were no regional wall motion   abnormalities. Left ventricular diastolic function parameters   were normal.  - Left atrium: The atrium was mildly dilated.  - Right ventricle: The cavity size was normal. Wall thickness was   normal. Systolic function was normal.  - Inferior vena cava: The vessel was normal in size. The    respirophasic diameter changes were in the normal range (>= 50%),   consistent  with normal central venous pressure.   Zio Patch Extended out patient EKG monitoring 7 days starting 03/30/2023:   Predominant Rhythm : Normal sinus rhythm.  Min HR: 47 bpm at 6:30 AM. Max HR 169 bpm at 4:39 PM   Atrial arrhythmias: Rare PACs and atrial couplets.   Atrial fibrillation: None   Ventricular arrhythmias: There was one 4 beat ventricular tachycardia at 5:30 AM on 03/31/2023.  Occasional PVCs.  PVC Burden <1%   Heart Block: None   Symptoms: Triggered events reveal PVCs and PACs.  Compared to 2022 Zio patch monitoring, no significant change.  EKG:   EKG 03/30/2023: Normal sinus rhythm at rate of 86 bpm, normal EKG.   Medications and allergies   Allergies  Allergen Reactions   Tramadol Shortness Of Breath   Dexlansoprazole     Other reaction(s): heart skip   Esomeprazole Magnesium     Other reaction(s): heart palpitations     Medication list   Current Outpatient Medications:    Cholecalciferol 50 MCG (2000 UT) CAPS, Take 1 capsule by mouth daily., Disp: , Rfl:    famotidine (PEPCID) 20 MG tablet, Take 20 mg by mouth daily as needed for indigestion or heartburn., Disp: , Rfl:    fluticasone (FLONASE ALLERGY RELIEF) 50 MCG/ACT nasal spray, 1 spray in each nostril, Disp: , Rfl:    loratadine (CLARITIN) 10 MG tablet, Take 10 mg by mouth daily., Disp: , Rfl:    Multiple Vitamin (MULTI VITAMIN) TABS, 1 tablet Orally Once a day, Disp: , Rfl:    NON FORMULARY, CPAP, Disp: , Rfl:    pseudoephedrine (SUDAFED) 60 MG tablet, Take 1 tablet (60 mg total) by mouth every 8 (eight) hours as needed for congestion., Disp: 30 tablet, Rfl: 0   XANAX 0.5 MG tablet, Take 0.5 mg by mouth 2 (two) times daily as needed., Disp: , Rfl:   Assessment     ICD-10-CM   1. Palpitations  R00.2     2. Elevated blood pressure reading in office without diagnosis of hypertension  R03.0     3. Class 3 severe obesity due to excess calories without serious comorbidity with body mass index (BMI) of 45.0  to 49.9 in adult (HCC)  E66.01    Z68.42        No orders of the defined types were placed in this encounter.   No orders of the defined types were placed in this encounter.   Medications Discontinued During This  Encounter  Medication Reason   cetirizine (ZYRTEC ALLERGY) 10 MG tablet       Recommendations:   Derrick Hart  is a 32 y.o.  male with a significant PMH of morbid obesity with OSA on CPAP, GERD, H/O H. Pylori infection, chronic palpitations felt to be due to PACs and PVCs by Zio patch monitoring in 2022, presents for follow-up after he was evaluated by me 2 months ago.  Underwent Zio patch monitoring and presents for follow-up.   1. Palpitations The symptoms of palpitation again closely correlate with PACs and PVCs.  He did have asymptomatic 4 beat NSVT at 5:30 in the morning and this is clearly related to sleep apnea.  Symptomatic episodes correlated with rare PACs and PVCs and atrial couplets and triplets.  Again the symptoms could be related to sleep apnea but also related to his elevated blood pressure and also morbid obesity and obesity hypoventilation.  He is certainly at risk for atrial fibrillation.  If he has not had a recent sleep study, I would recommend that he have repeat sleep study to reevaluate his therapy.  He has been compliant with his CPAP.        2. Elevated blood pressure reading in office without diagnosis of hypertension I have again discussed with him regarding elevated blood pressure, weight loss will certainly help him to reduce his diastolic blood pressure to <80.  I would have preferred to start him on therapy, patient does not want to be on any medications for now.  Again discussed with him regarding primary prevention.  3. Class 3 severe obesity due to excess calories without serious comorbidity with body mass index (BMI) of 45.0 to 49.9 in adult [facility]) As dictated above.   Derrick Decamp, MD, [facility] 04/28/2023, 4:59 PM Office: [phone]

## 2023-06-02 DIAGNOSIS — F419 Anxiety disorder, unspecified: Secondary | ICD-10-CM | POA: Diagnosis not present

## 2023-06-02 DIAGNOSIS — R002 Palpitations: Secondary | ICD-10-CM | POA: Diagnosis not present

## 2023-06-02 DIAGNOSIS — G4733 Obstructive sleep apnea (adult) (pediatric): Secondary | ICD-10-CM | POA: Diagnosis not present

## 2023-06-02 DIAGNOSIS — I493 Ventricular premature depolarization: Secondary | ICD-10-CM | POA: Diagnosis not present

## 2023-07-19 DIAGNOSIS — Z0000 Encounter for general adult medical examination without abnormal findings: Secondary | ICD-10-CM | POA: Diagnosis not present

## 2023-07-19 DIAGNOSIS — Z6841 Body Mass Index (BMI) 40.0 and over, adult: Secondary | ICD-10-CM | POA: Diagnosis not present

## 2023-07-19 DIAGNOSIS — R7989 Other specified abnormal findings of blood chemistry: Secondary | ICD-10-CM | POA: Diagnosis not present

## 2023-07-19 DIAGNOSIS — E782 Mixed hyperlipidemia: Secondary | ICD-10-CM | POA: Diagnosis not present

## 2023-07-30 DIAGNOSIS — R002 Palpitations: Secondary | ICD-10-CM | POA: Diagnosis not present

## 2023-07-30 DIAGNOSIS — R1110 Vomiting, unspecified: Secondary | ICD-10-CM | POA: Diagnosis not present

## 2023-07-30 DIAGNOSIS — Z743 Need for continuous supervision: Secondary | ICD-10-CM | POA: Diagnosis not present

## 2023-07-30 DIAGNOSIS — R6889 Other general symptoms and signs: Secondary | ICD-10-CM | POA: Diagnosis not present

## 2023-08-11 ENCOUNTER — Encounter: Payer: Self-pay | Admitting: Cardiology

## 2023-08-11 ENCOUNTER — Ambulatory Visit: Payer: BC Managed Care – PPO | Admitting: Cardiology

## 2023-08-11 VITALS — BP 128/84 | HR 90 | Resp 16 | Ht 76.0 in | Wt 385.0 lb

## 2023-08-11 DIAGNOSIS — R002 Palpitations: Secondary | ICD-10-CM

## 2023-08-11 DIAGNOSIS — Z6841 Body Mass Index (BMI) 40.0 and over, adult: Secondary | ICD-10-CM | POA: Diagnosis not present

## 2023-08-11 DIAGNOSIS — R072 Precordial pain: Secondary | ICD-10-CM | POA: Diagnosis not present

## 2023-08-11 MED ORDER — PROPRANOLOL HCL 20 MG PO TABS: 20.0000 mg | ORAL_TABLET | Freq: Three times a day (TID) | ORAL | 3 refills | Status: DC | PRN

## 2023-08-11 NOTE — Progress Notes (Signed)
Primary Physician/Referring:  Tally Joe, MD  Patient ID: Derrick Hart, male    DOB: October 04, 1991, 32 y.o.   MRN: [id number]  Chief Complaint  Patient presents with   Palpitations   Follow-up   HPI:    Derrick Hart  is a 32 y.o.  male with a significant PMH of morbid obesity with OSA on CPAP, GERD, anxiety, chronic palpitations felt to be due to [facility] and PVCs by Zio patch monitoring made an appointment to see me as past Friday on 08/06/2023, he called EMS as he was having severe palpitations and felt chest discomfort. There is no history of tobacco use, alcohol use.  No history of hypertension or diabetes.   Past Medical History:  Diagnosis Date   Anxiety    Chronic pain of left wrist 10/07/2015   Cough 03/11/2012   GERD (gastroesophageal reflux disease)    Obesity (BMI 30-39.9) 04/22/2016   OSA (obstructive sleep apnea) 02/19/2016   Mild with AHI 9.4/hr   Panic attack    PVC (premature ventricular contraction) 04/22/2016   Snoring 04/22/2016   Stuffy and runny nose 03/11/2012   clear drainage from nose   Tenosynovitis of hand 02/2012   left   Past Surgical History:  Procedure Laterality Date   WISDOM TOOTH EXTRACTION     Family History  Problem Relation Age of Onset   Hypertension Mother    Anxiety disorder Mother    Allergic rhinitis Mother    Hypertension Father    Heart attack Maternal Grandmother    Heart attack Maternal Grandfather    Stroke Maternal Grandfather    Cancer Paternal Grandmother    Cancer Paternal Grandfather    Asthma Brother    Allergic rhinitis Brother    Angioedema Neg Hx    Atopy Neg Hx    Eczema Neg Hx    Immunodeficiency Neg Hx    Urticaria Neg Hx     Social History   Tobacco Use   Smoking status: Never   Smokeless tobacco: Current    Types: Snuff    Last attempt to quit: 02/19/2015  Substance Use Topics   Alcohol use: No    Alcohol/week: 0.0 standard drinks of alcohol    Comment: occasionally   Marital Status: Married  ROS   Review of Systems  Constitutional: Negative.  Cardiovascular:  Positive for chest pain, irregular heartbeat and palpitations. Negative for dyspnea on exertion, paroxysmal nocturnal dyspnea and syncope.  Respiratory: Negative.    Musculoskeletal: Negative.   Neurological: Negative.   Psychiatric/Behavioral:  The patient is nervous/anxious.    Objective      08/11/2023    3:19 PM 04/28/2023    3:49 PM 03/30/2023    1:24 PM  Vitals with BMI  Height 6\' 4"  6\' 4"  6\' 4"   Weight 385 lbs 361 lbs 365 lbs  BMI 46.88 43.96 44.45  Systolic 128 126 782  Diastolic 84 84 87  Pulse 90 93 88   SpO2: 96 %  Physical Exam Cardiovascular:     Rate and Rhythm: Normal rate.     Pulses: Normal pulses.     Heart sounds: Normal heart sounds. No murmur heard.    No gallop.  Pulmonary:     Effort: Pulmonary effort is normal.     Breath sounds: Normal breath sounds.  Musculoskeletal:     Right lower leg: No edema.     Left lower leg: No edema.  Skin:    General: Skin is warm and  dry.  Neurological:     Mental Status: He is alert.  Psychiatric:        Mood and Affect: Mood normal.    Laboratory examination:   External labs: 03/17/2023: Glucose 77, BUN/Cr 16/0.81. EGFR 119. Na/K 139/4.5. H/H 13.6/41.2. MCV 88.9. Platelets 187  08/30/2021 TSH 3.686  Cardiac studies:    Echocardiogram 10/29/2015 - Left ventricle: The cavity size was normal. Wall thickness was    increased in a pattern of mild LVH. Systolic function was normal.   The estimated ejection fraction was in the range of 55% to 60%.    Wall motion was normal; there were no regional wall motion   abnormalities. Left ventricular diastolic function parameters   were normal.  - Left atrium: The atrium was mildly dilated.  - Right ventricle: The cavity size was normal. Wall thickness was   normal. Systolic function was normal.  - Inferior vena cava: The vessel was normal in size. The    respirophasic diameter changes were in the normal  range (>= 50%),   consistent with normal central venous pressure.   Zio Patch Extended out patient EKG monitoring 7 days starting 03/30/2023:   Predominant Rhythm : Normal sinus rhythm.  Min HR: 47 bpm at 6:30 AM. Max HR 169 bpm at 4:39 PM   Atrial arrhythmias: Rare PACs and atrial couplets.   Atrial fibrillation: None   Ventricular arrhythmias: There was one 4 beat ventricular tachycardia at 5:30 AM on 03/31/2023.  Occasional PVCs.  PVC Burden <1%   Heart Block: None   Symptoms: Triggered events reveal PVCs and PACs.  Compared to 2022 Zio patch monitoring, no significant change.  EKG:   EKG 08/11/2023: Normal sinus rhythm at the rate of 83 bpm, normal EKG.  Compared to 03/30/2023, no significant change.  Medications and allergies   Allergies  Allergen Reactions   Tramadol Shortness Of Breath   Dexlansoprazole     Other reaction(s): heart skip   Esomeprazole Magnesium     Other reaction(s): heart palpitations     Medication list   Current Outpatient Medications:    Cholecalciferol 50 MCG (2000 UT) CAPS, Take 1 capsule by mouth daily., Disp: , Rfl:    famotidine (PEPCID) 20 MG tablet, Take 20 mg by mouth daily as needed for indigestion or heartburn., Disp: , Rfl:    fluticasone (FLONASE ALLERGY RELIEF) 50 MCG/ACT nasal spray, 1 spray in each nostril, Disp: , Rfl:    loratadine (CLARITIN) 10 MG tablet, Take 10 mg by mouth daily., Disp: , Rfl:    Multiple Vitamin (MULTI VITAMIN) TABS, 1 tablet Orally Once a day, Disp: , Rfl:    NON FORMULARY, CPAP, Disp: , Rfl:    XANAX 0.5 MG tablet, Take 0.5 mg by mouth 2 (two) times daily as needed., Disp: , Rfl:   Assessment     ICD-10-CM   1. Palpitations  R00.2 EKG 12-Lead       Orders Placed This Encounter  Procedures   EKG 12-Lead    No orders of the defined types were placed in this encounter.   Medications Discontinued During This Encounter  Medication Reason   pseudoephedrine (SUDAFED) 60 MG tablet        Recommendations:   Derrick Hart  is a 32 y.o.  male with a significant PMH of morbid obesity with OSA on CPAP, GERD, anxiety, chronic palpitations felt to be due to PACs and PVCs by Zio patch monitoring made an appointment to see me as  past Friday on 08/06/2023, he called EMS as he was having severe palpitations and felt chest discomfort.  1. Palpitations Patient extremely concerned about palpitations.  Most symptoms occurring at rest, I tried to reassure him.  I again reviewed with him regarding recent Zio patch monitoring.  Patient prefers to have further testing done, we will schedule him for a routine treadmill exercise stress test to see if arrhythmia is inducible.  After extensive discussion, he is now willing to take propranolol on a as needed basis which would help with both anxiety and palpitations. - EKG 12-Lead - PCV CARDIAC STRESS TEST; Future - propranolol (INDERAL) 20 MG tablet; Take 1 tablet (20 mg total) by mouth 3 (three) times daily as needed (Palpitations).  Dispense: 90 tablet; Refill: 3  2. Precordial pain Chest pain that he feels with palpitations appears noncardiac.  Lasting a few seconds.  To reassure him a cardiac stress test and an echocardiogram has been ordered to exclude any pathology. - PCV CARDIAC STRESS TEST; Future - PCV ECHOCARDIOGRAM COMPLETE; Future  3. Class 3 severe obesity due to excess calories without serious comorbidity with body mass index (BMI) of 45.0 to 49.9 in adult [facility]) Weight loss again discussed with the patient.  Unfortunately he is at risk for atrial fibrillation in view of OSA although compliant with CPAP, morbid obesity.  Will see him back on a as needed basis.          Yates Decamp, MD, [facility] 08/11/2023, 3:50 PM Office: [phone]

## 2023-08-12 DIAGNOSIS — G4733 Obstructive sleep apnea (adult) (pediatric): Secondary | ICD-10-CM | POA: Diagnosis not present

## 2023-08-31 ENCOUNTER — Ambulatory Visit: Payer: BC Managed Care – PPO

## 2023-08-31 DIAGNOSIS — R072 Precordial pain: Secondary | ICD-10-CM

## 2023-09-22 DIAGNOSIS — L821 Other seborrheic keratosis: Secondary | ICD-10-CM | POA: Diagnosis not present

## 2023-09-22 DIAGNOSIS — L218 Other seborrheic dermatitis: Secondary | ICD-10-CM | POA: Diagnosis not present

## 2023-09-22 DIAGNOSIS — L814 Other melanin hyperpigmentation: Secondary | ICD-10-CM | POA: Diagnosis not present

## 2023-09-22 DIAGNOSIS — D225 Melanocytic nevi of trunk: Secondary | ICD-10-CM | POA: Diagnosis not present

## 2024-02-17 DIAGNOSIS — G4733 Obstructive sleep apnea (adult) (pediatric): Secondary | ICD-10-CM | POA: Diagnosis not present

## 2024-02-29 DIAGNOSIS — M9903 Segmental and somatic dysfunction of lumbar region: Secondary | ICD-10-CM | POA: Diagnosis not present

## 2024-02-29 DIAGNOSIS — M62830 Muscle spasm of back: Secondary | ICD-10-CM | POA: Diagnosis not present

## 2024-02-29 DIAGNOSIS — M9902 Segmental and somatic dysfunction of thoracic region: Secondary | ICD-10-CM | POA: Diagnosis not present

## 2024-02-29 DIAGNOSIS — M9901 Segmental and somatic dysfunction of cervical region: Secondary | ICD-10-CM | POA: Diagnosis not present

## 2024-02-29 DIAGNOSIS — M545 Low back pain, unspecified: Secondary | ICD-10-CM | POA: Diagnosis not present

## 2024-03-02 DIAGNOSIS — M545 Low back pain, unspecified: Secondary | ICD-10-CM | POA: Diagnosis not present

## 2024-03-02 DIAGNOSIS — M9902 Segmental and somatic dysfunction of thoracic region: Secondary | ICD-10-CM | POA: Diagnosis not present

## 2024-03-02 DIAGNOSIS — M62830 Muscle spasm of back: Secondary | ICD-10-CM | POA: Diagnosis not present

## 2024-03-02 DIAGNOSIS — M9903 Segmental and somatic dysfunction of lumbar region: Secondary | ICD-10-CM | POA: Diagnosis not present

## 2024-03-03 DIAGNOSIS — M9903 Segmental and somatic dysfunction of lumbar region: Secondary | ICD-10-CM | POA: Diagnosis not present

## 2024-03-03 DIAGNOSIS — M9902 Segmental and somatic dysfunction of thoracic region: Secondary | ICD-10-CM | POA: Diagnosis not present

## 2024-03-03 DIAGNOSIS — M62830 Muscle spasm of back: Secondary | ICD-10-CM | POA: Diagnosis not present

## 2024-03-03 DIAGNOSIS — M545 Low back pain, unspecified: Secondary | ICD-10-CM | POA: Diagnosis not present

## 2024-03-09 ENCOUNTER — Ambulatory Visit
Admission: EM | Admit: 2024-03-09 | Discharge: 2024-03-09 | Disposition: A | Discharge status: Home / Self Care | Attending: Family Medicine | Admitting: Family Medicine

## 2024-03-09 DIAGNOSIS — K529 Noninfective gastroenteritis and colitis, unspecified: Secondary | ICD-10-CM | POA: Insufficient documentation

## 2024-03-09 DIAGNOSIS — R197 Diarrhea, unspecified: Secondary | ICD-10-CM | POA: Insufficient documentation

## 2024-03-09 LAB — C DIFFICILE QUICK SCREEN W PCR REFLEX
C Diff antigen: NEGATIVE
C Diff interpretation: NOT DETECTED
C Diff toxin: NEGATIVE

## 2024-03-09 MED ORDER — ONDANSETRON 4 MG PO TBDP: 4.0000 mg | ORAL_TABLET | Freq: Three times a day (TID) | ORAL | 0 refills | Status: AC | PRN

## 2024-03-09 MED ORDER — DIPHENOXYLATE-ATROPINE 2.5-0.025 MG PO TABS
1.0000 | ORAL_TABLET | Freq: Four times a day (QID) | ORAL | 0 refills | Status: AC | PRN
Start: 2024-03-09 — End: ?

## 2024-03-09 NOTE — ED Provider Notes (Signed)
 [facility]    CSN: [id number] Arrival date & time: 03/09/24  7829      History   Chief Complaint Chief Complaint  Patient presents with   Abdominal Pain    HPI Derrick Hart is a 33 y.o. male.    Abdominal Pain Here for nausea and continued diarrhea.  On March 15 he began having nausea and vomiting and diarrhea.  He only vomited once during all this but was very nauseated.  He also had fever that he last had on March 17.  The nausea improved on March 17 but he has continued to have nausea after he eats.  He was having loose stools about 3-4 times a day, and now it is increased to 10 times a day.  He has already had 5 stools this morning.  No blood in the stool  No abdominal cramping  No chronic conditions--no diabetes or hypertension  He is intolerant of tramadol, Nexium, and Dexilant.     Past Medical History:  Diagnosis Date   Anxiety    Chronic pain of left wrist 10/07/2015   Cough 03/11/2012   GERD (gastroesophageal reflux disease)    Obesity (BMI 30-39.9) 04/22/2016   OSA (obstructive sleep apnea) 02/19/2016   Mild with AHI 9.4/hr   Panic attack    PVC (premature ventricular contraction) 04/22/2016   Snoring 04/22/2016   Stuffy and runny nose 03/11/2012   clear drainage from nose   Tenosynovitis of hand 02/2012   left    Patient Active Problem List   Diagnosis Date Noted   Palpitation 10/29/2021   PAC (premature atrial contraction) 10/29/2021   Class 3 severe obesity due to excess calories without serious comorbidity with body mass index (BMI) of 45.0 to 49.9 in adult (HCC) 10/29/2021   PVC (premature ventricular contraction) 04/22/2016   Snoring 04/22/2016   Chest pain 04/22/2016   Obesity (BMI 30-39.9) 04/22/2016   Allergic rhinitis 03/09/2016   Heartburn 03/09/2016   OSA (obstructive sleep apnea) 02/19/2016   Chronic pain of left wrist 10/07/2015   Panic attack 08/11/2013    Past Surgical History:  Procedure Laterality Date   WISDOM  TOOTH EXTRACTION         Home Medications    Prior to Admission medications   Medication Sig Start Date End Date Taking? Authorizing Provider  diphenoxylate-atropine (LOMOTIL) 2.5-0.025 MG tablet Take 1 tablet by mouth 4 (four) times daily as needed for diarrhea or loose stools. 03/09/24  Yes Zenia Resides, MD  ondansetron (ZOFRAN-ODT) 4 MG disintegrating tablet Take 1 tablet (4 mg total) by mouth every 8 (eight) hours as needed for nausea or vomiting. 03/09/24  Yes Zenia Resides, MD  Cholecalciferol 50 MCG (2000 UT) CAPS Take 1 capsule by mouth daily.    [provider]  famotidine (PEPCID) 20 MG tablet Take 20 mg by mouth daily as needed for indigestion or heartburn.    [provider]  fluticasone (FLONASE ALLERGY RELIEF) 50 MCG/ACT nasal spray 1 spray in each nostril    [provider]  loratadine (CLARITIN) 10 MG tablet Take 10 mg by mouth daily.    [provider]  Multiple Vitamin (MULTI VITAMIN) TABS 1 tablet Orally Once a day    [provider]  NON FORMULARY CPAP    [provider]    Family History Family History  Problem Relation Age of Onset   Hypertension Mother    Anxiety disorder Mother    Allergic rhinitis Mother  Hypertension Father    Heart attack Maternal Grandmother    Heart attack Maternal Grandfather    Stroke Maternal Grandfather    Cancer Paternal Grandmother    Cancer Paternal Grandfather    Asthma Brother    Allergic rhinitis Brother    Angioedema Neg Hx    Atopy Neg Hx    Eczema Neg Hx    Immunodeficiency Neg Hx    Urticaria Neg Hx     Social History Social History   Tobacco Use   Smoking status: Never   Smokeless tobacco: Current    Types: Snuff    Last attempt to quit: 02/19/2015  Vaping Use   Vaping status: Never Used  Substance Use Topics   Alcohol use: No    Alcohol/week: 0.0 standard drinks of alcohol    Comment: occasionally   Drug use: No     Allergies    Tramadol, Dexlansoprazole, and Esomeprazole magnesium   Review of Systems Review of Systems  Gastrointestinal:  Positive for abdominal pain.     Physical Exam Triage Vital Signs ED Triage Vitals  Encounter Vitals Group     BP 03/09/24 0933 (!) 141/86     Systolic BP Percentile --      Diastolic BP Percentile --      Pulse Rate 03/09/24 0933 78     Resp 03/09/24 0933 18     Temp 03/09/24 0933 98.1 F (36.7 C)     Temp Source 03/09/24 0933 Oral     SpO2 03/09/24 0933 96 %     Weight --      Height --      Head Circumference --      Peak Flow --      Pain Score 03/09/24 0936 3     Pain Loc --      Pain Education --      Exclude from Growth Chart --    No data found.  Updated Vital Signs BP (!) 141/86 (BP Location: Right Arm)   Pulse 78   Temp 98.1 F (36.7 C) (Oral)   Resp 18   SpO2 96%   Visual Acuity Right Eye Distance:   Left Eye Distance:   Bilateral Distance:    Right Eye Near:   Left Eye Near:    Bilateral Near:     Physical Exam Vitals reviewed.  Constitutional:      General: He is not in acute distress.    Appearance: He is not ill-appearing, toxic-appearing or diaphoretic.  HENT:     Nose: Nose normal.     Mouth/Throat:     Mouth: Mucous membranes are moist.     Pharynx: No oropharyngeal exudate or posterior oropharyngeal erythema.  Eyes:     Extraocular Movements: Extraocular movements intact.     Conjunctiva/sclera: Conjunctivae normal.     Pupils: Pupils are equal, round, and reactive to light.  Cardiovascular:     Rate and Rhythm: Normal rate and regular rhythm.     Heart sounds: No murmur heard. Pulmonary:     Effort: Pulmonary effort is normal. No respiratory distress.     Breath sounds: Normal breath sounds. No stridor. No wheezing, rhonchi or rales.  Abdominal:     General: There is no distension.     Palpations: Abdomen is soft. There is no mass.     Tenderness: There is no abdominal tenderness. There is no guarding.   Musculoskeletal:     Cervical back: Neck supple.  Lymphadenopathy:  Cervical: No cervical adenopathy.  Skin:    Capillary Refill: Capillary refill takes less than 2 seconds.     Coloration: Skin is not jaundiced or pale.  Neurological:     General: No focal deficit present.     Mental Status: He is alert and oriented to person, place, and time.  Psychiatric:        Behavior: Behavior normal.      UC Treatments / Results  Labs (all labs ordered are listed, but only abnormal results are displayed) Labs Reviewed  CBC  COMPREHENSIVE METABOLIC PANEL    EKG   Radiology No results found.  Procedures Procedures (including critical care time)  Medications Ordered in UC Medications - No data to display  Initial Impression / Assessment and Plan / UC Course  I have reviewed the triage vital signs and the nursing notes.  Pertinent labs & imaging results that were available during my care of the patient were reviewed by me and considered in my medical decision making (see chart for details).     Lomotil and Zofran sent in to control symptoms.  CBC and CMP drawn.  We will notify him if anything significantly abnormal.  Vital signs are reassuring here with a normal heart rate, he stated he felt like he was getting dehydrated.  Supplies are given for collecting a stool sample which will be tested with a GI pathogen panel and O&P and C. difficile.  Staff will notify him if anything is positive on that testing. Final Clinical Impressions(s) / UC Diagnoses   Final diagnoses:  Gastroenteritis  Diarrhea, unspecified type     Discharge Instructions      Ondansetron dissolved in the mouth every 8 hours as needed for nausea or vomiting. Clear liquids(water, gatorade/pedialyte, ginger ale/sprite, chicken broth/soup) and bland things(crackers/toast, rice, potato, bananas) to eat. Avoid acidic foods like lemon/lime/orange/tomato, and avoid greasy/spicy  foods.  Diphenoxylate-atropine-take 1 tablet 4 times a day as needed for diarrhea  We have drawn blood to check your blood counts and kidney and liver function and electrolytes.  Our staff will notify you if anything is significantly abnormal.  Please collect a stool sample and bring it back to this clinic and we will test for infections.  Our staff will notify you if anything is positive that needs treatment.       ED Prescriptions     Medication Sig Dispense Auth. Provider   ondansetron (ZOFRAN-ODT) 4 MG disintegrating tablet Take 1 tablet (4 mg total) by mouth every 8 (eight) hours as needed for nausea or vomiting. 10 tablet Zenia Resides, MD   diphenoxylate-atropine (LOMOTIL) 2.5-0.025 MG tablet Take 1 tablet by mouth 4 (four) times daily as needed for diarrhea or loose stools. 16 tablet Pearlee Arvizu, Janace Aris, MD      I have reviewed the PDMP during this encounter.   Zenia Resides, MD 03/09/24 [id number]

## 2024-03-09 NOTE — ED Triage Notes (Signed)
 Pt reports, nausea, diarrhea and on and off abdominal cramping x 5 days. Pt not taking any meds for complaints.

## 2024-03-09 NOTE — Discharge Instructions (Signed)
 Ondansetron dissolved in the mouth every 8 hours as needed for nausea or vomiting. Clear liquids(water, gatorade/pedialyte, ginger ale/sprite, chicken broth/soup) and bland things(crackers/toast, rice, potato, bananas) to eat. Avoid acidic foods like lemon/lime/orange/tomato, and avoid greasy/spicy foods.  Diphenoxylate-atropine-take 1 tablet 4 times a day as needed for diarrhea  We have drawn blood to check your blood counts and kidney and liver function and electrolytes.  Our staff will notify you if anything is significantly abnormal.  Please collect a stool sample and bring it back to this clinic and we will test for infections.  Our staff will notify you if anything is positive that needs treatment.

## 2024-03-10 LAB — CBC
Hematocrit: 45.2 % (ref 37.5–51.0)
Hemoglobin: 14.8 g/dL (ref 13.0–17.7)
MCH: 28.8 pg (ref 26.6–33.0)
MCHC: 32.7 g/dL (ref 31.5–35.7)
MCV: 88 fL (ref 79–97)
Platelets: 226 10*3/uL (ref 150–450)
RBC: 5.14 x10E6/uL (ref 4.14–5.80)
RDW: 12.8 % (ref 11.6–15.4)
WBC: 5.2 10*3/uL (ref 3.4–10.8)

## 2024-03-10 LAB — COMPREHENSIVE METABOLIC PANEL
ALT: 37 IU/L (ref 0–44)
AST: 27 IU/L (ref 0–40)
Albumin: 4.9 g/dL (ref 4.1–5.1)
Alkaline Phosphatase: 63 IU/L (ref 44–121)
BUN/Creatinine Ratio: 17 (ref 9–20)
BUN: 15 mg/dL (ref 6–20)
Bilirubin Total: 0.5 mg/dL (ref 0.0–1.2)
CO2: 17 mmol/L — ABNORMAL LOW (ref 20–29)
Calcium: 9.2 mg/dL (ref 8.7–10.2)
Chloride: 107 mmol/L — ABNORMAL HIGH (ref 96–106)
Creatinine, Ser: 0.87 mg/dL (ref 0.76–1.27)
Globulin, Total: 2.9 g/dL (ref 1.5–4.5)
Glucose: 92 mg/dL (ref 70–99)
Potassium: 4.9 mmol/L (ref 3.5–5.2)
Sodium: 141 mmol/L (ref 134–144)
Total Protein: 7.8 g/dL (ref 6.0–8.5)
eGFR: 116 mL/min/{1.73_m2} (ref >59)

## 2024-03-11 LAB — GASTROINTESTINAL PANEL BY PCR, STOOL (REPLACES STOOL CULTURE)

## 2024-03-13 DIAGNOSIS — M9902 Segmental and somatic dysfunction of thoracic region: Secondary | ICD-10-CM | POA: Diagnosis not present

## 2024-03-13 DIAGNOSIS — M545 Low back pain, unspecified: Secondary | ICD-10-CM | POA: Diagnosis not present

## 2024-03-13 DIAGNOSIS — M9903 Segmental and somatic dysfunction of lumbar region: Secondary | ICD-10-CM | POA: Diagnosis not present

## 2024-03-13 DIAGNOSIS — M62830 Muscle spasm of back: Secondary | ICD-10-CM | POA: Diagnosis not present

## 2024-03-20 DIAGNOSIS — M62830 Muscle spasm of back: Secondary | ICD-10-CM | POA: Diagnosis not present

## 2024-03-20 DIAGNOSIS — M9902 Segmental and somatic dysfunction of thoracic region: Secondary | ICD-10-CM | POA: Diagnosis not present

## 2024-03-20 DIAGNOSIS — M545 Low back pain, unspecified: Secondary | ICD-10-CM | POA: Diagnosis not present

## 2024-03-20 DIAGNOSIS — M9903 Segmental and somatic dysfunction of lumbar region: Secondary | ICD-10-CM | POA: Diagnosis not present

## 2024-07-27 DIAGNOSIS — E782 Mixed hyperlipidemia: Secondary | ICD-10-CM | POA: Diagnosis not present

## 2024-07-27 DIAGNOSIS — Z0000 Encounter for general adult medical examination without abnormal findings: Secondary | ICD-10-CM | POA: Diagnosis not present

## 2024-07-27 DIAGNOSIS — E291 Testicular hypofunction: Secondary | ICD-10-CM | POA: Diagnosis not present

## 2024-08-09 DIAGNOSIS — E291 Testicular hypofunction: Secondary | ICD-10-CM | POA: Diagnosis not present

## 2024-08-15 DIAGNOSIS — E291 Testicular hypofunction: Secondary | ICD-10-CM | POA: Diagnosis not present

## 2024-08-22 DIAGNOSIS — E291 Testicular hypofunction: Secondary | ICD-10-CM | POA: Diagnosis not present

## 2024-09-25 DIAGNOSIS — D1801 Hemangioma of skin and subcutaneous tissue: Secondary | ICD-10-CM | POA: Diagnosis not present

## 2024-09-25 DIAGNOSIS — L821 Other seborrheic keratosis: Secondary | ICD-10-CM | POA: Diagnosis not present

## 2024-09-25 DIAGNOSIS — L814 Other melanin hyperpigmentation: Secondary | ICD-10-CM | POA: Diagnosis not present
